# Patient Record
Sex: Female | Born: 1985 | Race: White | Hispanic: No | Marital: Married | Smoking: Never smoker
Health system: Southern US, Community
[De-identification: ages and names within clinical notes are randomized; demographics above are authoritative.]

## PROBLEM LIST (undated history)

## (undated) DIAGNOSIS — F988 Other specified behavioral and emotional disorders with onset usually occurring in childhood and adolescence: Secondary | ICD-10-CM

## (undated) DIAGNOSIS — G43909 Migraine, unspecified, not intractable, without status migrainosus: Secondary | ICD-10-CM

## (undated) DIAGNOSIS — R002 Palpitations: Secondary | ICD-10-CM

## (undated) DIAGNOSIS — S338XXA Sprain of other parts of lumbar spine and pelvis, initial encounter: Secondary | ICD-10-CM

## (undated) DIAGNOSIS — R5383 Other fatigue: Secondary | ICD-10-CM

## (undated) DIAGNOSIS — F3281 Premenstrual dysphoric disorder: Secondary | ICD-10-CM

## (undated) DIAGNOSIS — R87629 Unspecified abnormal cytological findings in specimens from vagina: Secondary | ICD-10-CM

## (undated) DIAGNOSIS — F419 Anxiety disorder, unspecified: Secondary | ICD-10-CM

## (undated) DIAGNOSIS — T7840XA Allergy, unspecified, initial encounter: Secondary | ICD-10-CM

## (undated) HISTORY — DX: Allergy, unspecified, initial encounter: T78.40XA

## (undated) HISTORY — PX: NO PAST SURGERIES: SHX2092

## (undated) HISTORY — DX: Premenstrual dysphoric disorder: F32.81

## (undated) HISTORY — DX: Unspecified abnormal cytological findings in specimens from vagina: R87.629

## (undated) HISTORY — DX: Migraine, unspecified, not intractable, without status migrainosus: G43.909

## (undated) HISTORY — DX: Palpitations: R00.2

## (undated) HISTORY — DX: Other specified behavioral and emotional disorders with onset usually occurring in childhood and adolescence: F98.8

## (undated) HISTORY — DX: Anxiety disorder, unspecified: F41.9

## (undated) HISTORY — DX: Other fatigue: R53.83

---
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
## 2005-04-28 ENCOUNTER — Ambulatory Visit: Payer: Self-pay | Admitting: "Endocrinology

## 2005-06-28 ENCOUNTER — Ambulatory Visit: Payer: Self-pay | Admitting: "Endocrinology

## 2006-12-24 ENCOUNTER — Other Ambulatory Visit: Admission: RE | Admit: 2006-12-24 | Discharge: 2006-12-24 | Payer: Self-pay | Admitting: Family Medicine

## 2007-07-17 ENCOUNTER — Other Ambulatory Visit: Admission: RE | Admit: 2007-07-17 | Discharge: 2007-07-17 | Payer: Self-pay | Admitting: Family Medicine

## 2007-12-30 ENCOUNTER — Other Ambulatory Visit: Admission: RE | Admit: 2007-12-30 | Discharge: 2007-12-30 | Payer: Self-pay | Admitting: Family Medicine

## 2008-07-06 ENCOUNTER — Other Ambulatory Visit: Admission: RE | Admit: 2008-07-06 | Discharge: 2008-07-06 | Payer: Self-pay | Admitting: Family Medicine

## 2009-01-28 ENCOUNTER — Other Ambulatory Visit: Admission: RE | Admit: 2009-01-28 | Discharge: 2009-01-28 | Payer: Self-pay | Admitting: Family Medicine

## 2010-03-22 ENCOUNTER — Other Ambulatory Visit: Admission: RE | Admit: 2010-03-22 | Discharge: 2010-03-22 | Payer: Self-pay | Admitting: Family Medicine

## 2011-03-27 ENCOUNTER — Other Ambulatory Visit (HOSPITAL_COMMUNITY)
Admission: RE | Admit: 2011-03-27 | Discharge: 2011-03-27 | Disposition: A | Discharge status: Home / Self Care | Payer: BC Managed Care – PPO | Source: Ambulatory Visit | Attending: Family Medicine | Admitting: Family Medicine

## 2011-03-27 ENCOUNTER — Other Ambulatory Visit: Payer: Self-pay | Admitting: Family Medicine

## 2011-03-27 DIAGNOSIS — Z0000 Encounter for general adult medical examination without abnormal findings: Secondary | ICD-10-CM | POA: Insufficient documentation

## 2011-03-27 DIAGNOSIS — Z113 Encounter for screening for infections with a predominantly sexual mode of transmission: Secondary | ICD-10-CM | POA: Insufficient documentation

## 2011-03-27 DIAGNOSIS — R87810 Cervical high risk human papillomavirus (HPV) DNA test positive: Secondary | ICD-10-CM | POA: Insufficient documentation

## 2011-04-10 ENCOUNTER — Other Ambulatory Visit: Payer: Self-pay | Admitting: Family Medicine

## 2011-11-28 ENCOUNTER — Other Ambulatory Visit (HOSPITAL_COMMUNITY)
Admission: RE | Admit: 2011-11-28 | Discharge: 2011-11-28 | Disposition: A | Discharge status: Home / Self Care | Payer: BC Managed Care – PPO | Source: Ambulatory Visit | Attending: Family Medicine | Admitting: Family Medicine

## 2011-11-28 ENCOUNTER — Other Ambulatory Visit: Payer: Self-pay | Admitting: Family Medicine

## 2011-11-28 DIAGNOSIS — R87619 Unspecified abnormal cytological findings in specimens from cervix uteri: Secondary | ICD-10-CM | POA: Insufficient documentation

## 2012-07-29 ENCOUNTER — Other Ambulatory Visit: Payer: Self-pay | Admitting: Family Medicine

## 2012-07-29 ENCOUNTER — Other Ambulatory Visit (HOSPITAL_COMMUNITY)
Admission: RE | Admit: 2012-07-29 | Discharge: 2012-07-29 | Disposition: A | Discharge status: Home / Self Care | Payer: BC Managed Care – PPO | Source: Ambulatory Visit | Attending: Family Medicine | Admitting: Family Medicine

## 2012-07-29 DIAGNOSIS — Z01419 Encounter for gynecological examination (general) (routine) without abnormal findings: Secondary | ICD-10-CM | POA: Insufficient documentation

## 2013-02-11 ENCOUNTER — Other Ambulatory Visit: Payer: Self-pay | Admitting: Family Medicine

## 2013-02-11 ENCOUNTER — Other Ambulatory Visit (HOSPITAL_COMMUNITY)
Admission: RE | Admit: 2013-02-11 | Discharge: 2013-02-11 | Disposition: A | Discharge status: Home / Self Care | Payer: BC Managed Care – PPO | Source: Ambulatory Visit | Attending: Family Medicine | Admitting: Family Medicine

## 2013-02-11 DIAGNOSIS — R87619 Unspecified abnormal cytological findings in specimens from cervix uteri: Secondary | ICD-10-CM | POA: Insufficient documentation

## 2014-06-22 ENCOUNTER — Other Ambulatory Visit: Payer: Self-pay | Admitting: Family Medicine

## 2014-06-22 ENCOUNTER — Other Ambulatory Visit (HOSPITAL_COMMUNITY)
Admission: RE | Admit: 2014-06-22 | Discharge: 2014-06-22 | Disposition: A | Discharge status: Home / Self Care | Payer: BC Managed Care – PPO | Source: Ambulatory Visit | Attending: Family Medicine | Admitting: Family Medicine

## 2014-06-22 DIAGNOSIS — Z124 Encounter for screening for malignant neoplasm of cervix: Secondary | ICD-10-CM | POA: Diagnosis not present

## 2014-06-25 LAB — CYTOLOGY - PAP

## 2015-07-11 DIAGNOSIS — S338XXA Sprain of other parts of lumbar spine and pelvis, initial encounter: Secondary | ICD-10-CM

## 2015-07-11 HISTORY — DX: Sprain of other parts of lumbar spine and pelvis, initial encounter: S33.8XXA

## 2016-07-25 ENCOUNTER — Other Ambulatory Visit (HOSPITAL_COMMUNITY)
Admission: RE | Admit: 2016-07-25 | Discharge: 2016-07-25 | Disposition: A | Discharge status: Home / Self Care | Payer: BC Managed Care – PPO | Source: Ambulatory Visit | Attending: Family Medicine | Admitting: Family Medicine

## 2016-07-25 ENCOUNTER — Other Ambulatory Visit: Payer: Self-pay | Admitting: Family Medicine

## 2016-07-25 DIAGNOSIS — Z124 Encounter for screening for malignant neoplasm of cervix: Secondary | ICD-10-CM | POA: Diagnosis present

## 2016-08-01 LAB — CYTOLOGY - PAP: Diagnosis: NEGATIVE

## 2016-12-25 LAB — OB RESULTS CONSOLE RUBELLA ANTIBODY, IGM: RUBELLA: IMMUNE

## 2016-12-25 LAB — OB RESULTS CONSOLE HIV ANTIBODY (ROUTINE TESTING): HIV: NONREACTIVE

## 2016-12-25 LAB — OB RESULTS CONSOLE HEPATITIS B SURFACE ANTIGEN: HEP B S AG: NEGATIVE

## 2016-12-25 LAB — OB RESULTS CONSOLE ABO/RH: RH TYPE: POSITIVE

## 2016-12-25 LAB — OB RESULTS CONSOLE ANTIBODY SCREEN: Antibody Screen: NEGATIVE

## 2016-12-25 LAB — OB RESULTS CONSOLE RPR: RPR: NONREACTIVE

## 2017-01-22 LAB — OB RESULTS CONSOLE GC/CHLAMYDIA
Chlamydia: NEGATIVE
GC PROBE AMP, GENITAL: NEGATIVE

## 2017-04-13 ENCOUNTER — Ambulatory Visit: Payer: BC Managed Care – PPO | Admitting: Internal Medicine

## 2017-04-24 ENCOUNTER — Encounter: Payer: Self-pay | Admitting: Physician Assistant

## 2017-04-24 ENCOUNTER — Ambulatory Visit (INDEPENDENT_AMBULATORY_CARE_PROVIDER_SITE_OTHER): Payer: BC Managed Care – PPO | Admitting: Physician Assistant

## 2017-04-24 ENCOUNTER — Encounter (INDEPENDENT_AMBULATORY_CARE_PROVIDER_SITE_OTHER): Payer: Self-pay

## 2017-04-24 VITALS — BP 108/60 | HR 90 | Ht 64.0 in | Wt 249.0 lb

## 2017-04-24 DIAGNOSIS — Z3A20 20 weeks gestation of pregnancy: Secondary | ICD-10-CM | POA: Diagnosis not present

## 2017-04-24 DIAGNOSIS — R002 Palpitations: Secondary | ICD-10-CM | POA: Diagnosis not present

## 2017-04-24 NOTE — Progress Notes (Signed)
Cardiology Office Note    Date:  04/24/2017   ID:  REIZY DUNLOW, DOB 1986/04/01, MRN [id number]  PCP:  Laurann Montana, MD  Cardiologist:  New to Dr. Eden Emms   Chief Complaint: Palpitation  History of Present Illness:   Doris Martinez is a 31 y.o. female with possible hyperthyroidism ? (only took supplement for 1 months) , ADD and anxiety referred by Precious Gilding, MD(obgyn) for palpitations.   Normal TSH 04/10/17.  She is [redacted] weeks pregnant. Patient has noted palpitations approximately 2-3 weeks ago. Her baseline heart rate in 80s. However she feels palpitation when her rate goes  to 90s at rest. She does not feel palpitation when heart rate about 110 during activity. She denies chest pain, shortness of breath, orthopnea, PND, syncope, lower extremity edema or melena. She did have a history of palpitation at age 16. No workup needed at that time.   Social smoker during college years.  Past Medical History:  Diagnosis Date  . Abnormal vaginal Pap smear   . ADD (attention deficit disorder)   . Allergy   . Anxiety   . Fatigue   . Heart palpitations   . Hyperthyroidism   . Migraine headache   . Pelvic fracture (HCC)   . Premenstrual dysphoric disorder     History reviewed. No pertinent surgical history.  Current Medications:  Prior to Admission medications   Medication Sig Start Date End Date Taking? Authorizing Provider  hydrOXYzine (ATARAX/VISTARIL) 25 MG tablet Take 25 mg by mouth 2 (two) times daily as needed for anxiety.   Yes [provider]  Omega-3 Fatty Acids (FISH OIL PO) Take 1 capsule by mouth daily.   Yes [provider]  Prenatal Vit-Fe Fumarate-FA (PRENATAL VITAMIN PO) Take 1 tablet by mouth daily.   Yes [provider]   Allergies:   Patient has no known allergies.   Social History   Social History  . Marital status: Single    Spouse name: N/A  . Number of children: N/A  . Years of education: N/A   Social History Main  Topics  . Smoking status: Never Smoker  . Smokeless tobacco: Never Used  . Alcohol use 1.2 oz/week    2 Glasses of wine per week     Comment: occasionally  . Drug use: No  . Sexual activity: Not Asked     Comment: married Joselyn Glassman)   Other Topics Concern  . None   Social History Narrative  . None     Family History:  The patient's family history includes Allergies in her father and mother; CAD in her paternal grandfather; Dementia in her maternal grandmother; Diabetes in her paternal grandmother; Hypertension in her father and paternal grandfather; Varicose Veins in her mother.   ROS:   Please see the history of present illness.    ROS All other systems reviewed and are negative.   PHYSICAL EXAM:   VS:  BP 108/60   Pulse 90   Ht  (1.626 m)   Wt 249 lb (112.9 kg)   BMI 42.74 kg/m    GEN: Well nourished, well developed, in no acute distress  HEENT: normal  Neck: no JVD, carotid bruits, or masses Cardiac: RRR; no murmurs, rubs, or gallops,no edema  Respiratory:  clear to auscultation bilaterally, normal work of breathing GI: soft, nontender, nondistended, + BS MS: no deformity or atrophy  Skin: warm and dry, no rash Neuro:  Alert and Oriented x 3, Strength and sensation are  intact Psych: euthymic mood, full affect  Wt Readings from Last 3 Encounters:  04/24/17 249 lb (112.9 kg)      Studies/Labs Reviewed:   EKG:  EKG is ordered today.  The ekg ordered today demonstrates NSR  Recent Labs: No results found for requested labs within last 8760 hours.   Lipid Panel No results found for: CHOL, TRIG, HDL, CHOLHDL, VLDL, LDLCALC, LDLDIRECT  Additional studies/ records that were reviewed today include:   As above    ASSESSMENT & PLAN:    1. Palpitation - Improving in past one week. Likely related to pregnancy. Encouraged increasing fluid intake. Get echocardiogram to rule out any structural or valvular abnormality. No history of premature cardiac death in  family. Normal TSH.   Reviewed with Dr. Eden Emms  Medication Adjustments/Labs and Tests Ordered: Current medicines are reviewed at length with the patient today.  Concerns regarding medicines are outlined above.  Medication changes, Labs and Tests ordered today are listed in the Patient Instructions below. Patient Instructions  Medication Instructions:  Your physician recommends that you continue on your current medications as directed. Please refer to the Current Medication list given to you today.   Labwork: None Ordered   Testing/Procedures: Your physician has requested that you have an echocardiogram. Echocardiography is a painless test that uses sound waves to create images of your heart. It provides your doctor with information about the size and shape of your heart and how well your heart's chambers and valves are working. This procedure takes approximately one hour. There are no restrictions for this procedure.   Follow-Up: Your physician recommends that you schedule a follow-up appointment in: as needed with Dr. Eden Emms pending the results of the echocardiogram   If you need a refill on your cardiac medications before your next appointment, please call your pharmacy.   Thank you for choosing [facility]! Eligha Bridegroom, RN [phone]       Signed, Manson Passey, PA  04/24/2017 4:00 PM    [facility] [address] [place], [place], [place]  [address] Phone: [phone]; Fax: [phone]

## 2017-04-24 NOTE — Patient Instructions (Signed)
Medication Instructions:  Your physician recommends that you continue on your current medications as directed. Please refer to the Current Medication list given to you today.   Labwork: None Ordered   Testing/Procedures: Your physician has requested that you have an echocardiogram. Echocardiography is a painless test that uses sound waves to create images of your heart. It provides your doctor with information about the size and shape of your heart and how well your heart's chambers and valves are working. This procedure takes approximately one hour. There are no restrictions for this procedure.   Follow-Up: Your physician recommends that you schedule a follow-up appointment in: as needed with Dr. Eden Emms pending the results of the echocardiogram   If you need a refill on your cardiac medications before your next appointment, please call your pharmacy.   Thank you for choosing [facility]! Eligha Bridegroom, RN [phone]

## 2017-05-03 ENCOUNTER — Ambulatory Visit: Payer: BC Managed Care – PPO | Admitting: Cardiology

## 2017-05-08 LAB — OB RESULTS CONSOLE HIV ANTIBODY (ROUTINE TESTING): HIV: NONREACTIVE

## 2017-05-08 LAB — OB RESULTS CONSOLE RPR: RPR: NONREACTIVE

## 2017-05-11 ENCOUNTER — Other Ambulatory Visit: Payer: Self-pay

## 2017-05-11 ENCOUNTER — Ambulatory Visit (HOSPITAL_COMMUNITY): Payer: BC Managed Care – PPO | Attending: Cardiology

## 2017-05-11 DIAGNOSIS — O26892 Other specified pregnancy related conditions, second trimester: Secondary | ICD-10-CM | POA: Insufficient documentation

## 2017-05-11 DIAGNOSIS — Z3A25 25 weeks gestation of pregnancy: Secondary | ICD-10-CM | POA: Diagnosis not present

## 2017-05-11 DIAGNOSIS — R002 Palpitations: Secondary | ICD-10-CM | POA: Insufficient documentation

## 2017-05-11 DIAGNOSIS — I071 Rheumatic tricuspid insufficiency: Secondary | ICD-10-CM | POA: Diagnosis not present

## 2017-05-11 DIAGNOSIS — Z8249 Family history of ischemic heart disease and other diseases of the circulatory system: Secondary | ICD-10-CM | POA: Insufficient documentation

## 2017-05-23 ENCOUNTER — Other Ambulatory Visit: Payer: Self-pay | Admitting: Obstetrics and Gynecology

## 2017-05-23 DIAGNOSIS — Z3689 Encounter for other specified antenatal screening: Secondary | ICD-10-CM

## 2017-05-23 DIAGNOSIS — R1011 Right upper quadrant pain: Secondary | ICD-10-CM

## 2017-05-25 ENCOUNTER — Encounter (HOSPITAL_COMMUNITY): Payer: Self-pay | Admitting: *Deleted

## 2017-05-25 ENCOUNTER — Other Ambulatory Visit: Payer: Self-pay | Admitting: Obstetrics and Gynecology

## 2017-05-25 ENCOUNTER — Ambulatory Visit (HOSPITAL_COMMUNITY)
Admission: RE | Admit: 2017-05-25 | Discharge: 2017-05-25 | Disposition: A | Discharge status: Home / Self Care | Payer: BC Managed Care – PPO | Source: Ambulatory Visit | Attending: Obstetrics and Gynecology | Admitting: Obstetrics and Gynecology

## 2017-05-25 ENCOUNTER — Encounter (HOSPITAL_COMMUNITY): Payer: Self-pay

## 2017-05-25 DIAGNOSIS — R1011 Right upper quadrant pain: Secondary | ICD-10-CM

## 2017-05-25 DIAGNOSIS — Z3689 Encounter for other specified antenatal screening: Secondary | ICD-10-CM | POA: Diagnosis not present

## 2017-05-25 DIAGNOSIS — O9989 Other specified diseases and conditions complicating pregnancy, childbirth and the puerperium: Secondary | ICD-10-CM | POA: Insufficient documentation

## 2017-05-25 HISTORY — DX: Unspecified abnormal cytological findings in specimens from vagina: R87.629

## 2017-08-18 ENCOUNTER — Encounter (HOSPITAL_COMMUNITY): Payer: Self-pay | Admitting: *Deleted

## 2017-08-18 ENCOUNTER — Inpatient Hospital Stay (HOSPITAL_COMMUNITY): Payer: BC Managed Care – PPO

## 2017-08-18 ENCOUNTER — Inpatient Hospital Stay (HOSPITAL_COMMUNITY)
Admission: AD | Admit: 2017-08-18 | Discharge: 2017-08-23 | DRG: 787 | Disposition: A | Discharge status: Home / Self Care | Payer: BC Managed Care – PPO | Source: Ambulatory Visit | Attending: Obstetrics and Gynecology | Admitting: Obstetrics and Gynecology

## 2017-08-18 ENCOUNTER — Telehealth: Payer: Self-pay | Admitting: Obstetrics and Gynecology

## 2017-08-18 ENCOUNTER — Other Ambulatory Visit: Payer: Self-pay

## 2017-08-18 DIAGNOSIS — O9912 Other diseases of the blood and blood-forming organs and certain disorders involving the immune mechanism complicating childbirth: Secondary | ICD-10-CM | POA: Diagnosis present

## 2017-08-18 DIAGNOSIS — O99214 Obesity complicating childbirth: Secondary | ICD-10-CM | POA: Diagnosis present

## 2017-08-18 DIAGNOSIS — O403XX0 Polyhydramnios, third trimester, not applicable or unspecified: Principal | ICD-10-CM | POA: Diagnosis present

## 2017-08-18 DIAGNOSIS — D696 Thrombocytopenia, unspecified: Secondary | ICD-10-CM | POA: Diagnosis present

## 2017-08-18 DIAGNOSIS — Z3A39 39 weeks gestation of pregnancy: Secondary | ICD-10-CM

## 2017-08-18 DIAGNOSIS — O409XX0 Polyhydramnios, unspecified trimester, not applicable or unspecified: Secondary | ICD-10-CM | POA: Diagnosis present

## 2017-08-18 DIAGNOSIS — O99210 Obesity complicating pregnancy, unspecified trimester: Secondary | ICD-10-CM | POA: Diagnosis present

## 2017-08-18 DIAGNOSIS — O26893 Other specified pregnancy related conditions, third trimester: Secondary | ICD-10-CM | POA: Diagnosis present

## 2017-08-18 DIAGNOSIS — F419 Anxiety disorder, unspecified: Secondary | ICD-10-CM | POA: Diagnosis present

## 2017-08-18 DIAGNOSIS — Z98891 History of uterine scar from previous surgery: Secondary | ICD-10-CM

## 2017-08-18 DIAGNOSIS — O99344 Other mental disorders complicating childbirth: Secondary | ICD-10-CM | POA: Diagnosis present

## 2017-08-18 DIAGNOSIS — Z8781 Personal history of (healed) traumatic fracture: Secondary | ICD-10-CM

## 2017-08-18 DIAGNOSIS — R002 Palpitations: Secondary | ICD-10-CM | POA: Diagnosis not present

## 2017-08-18 DIAGNOSIS — R030 Elevated blood-pressure reading, without diagnosis of hypertension: Secondary | ICD-10-CM | POA: Diagnosis present

## 2017-08-18 HISTORY — DX: Sprain of other parts of lumbar spine and pelvis, initial encounter: S33.8XXA

## 2017-08-18 LAB — CBC
HCT: 38.3 % (ref 36.0–46.0)
Hemoglobin: 12.8 g/dL (ref 12.0–15.0)
MCH: 27.6 pg (ref 26.0–34.0)
MCHC: 33.4 g/dL (ref 30.0–36.0)
MCV: 82.7 fL (ref 78.0–100.0)
PLATELETS: 154 10*3/uL (ref 150–400)
RBC: 4.63 MIL/uL (ref 3.87–5.11)
RDW: 13.9 % (ref 11.5–15.5)
WBC: 12.8 10*3/uL — ABNORMAL HIGH (ref 4.0–10.5)

## 2017-08-18 LAB — COMPREHENSIVE METABOLIC PANEL
ALT: 15 U/L (ref 14–54)
ANION GAP: 7 (ref 5–15)
AST: 22 U/L (ref 15–41)
Albumin: 2.7 g/dL — ABNORMAL LOW (ref 3.5–5.0)
Alkaline Phosphatase: 162 U/L — ABNORMAL HIGH (ref 38–126)
BUN: 7 mg/dL (ref 6–20)
CHLORIDE: 105 mmol/L (ref 101–111)
CO2: 22 mmol/L (ref 22–32)
Calcium: 8.9 mg/dL (ref 8.9–10.3)
Creatinine, Ser: 0.59 mg/dL (ref 0.44–1.00)
GFR calc non Af Amer: >60 mL/min (ref >60)
Glucose, Bld: 74 mg/dL (ref 65–99)
POTASSIUM: 4.3 mmol/L (ref 3.5–5.1)
Sodium: 134 mmol/L — ABNORMAL LOW (ref 135–145)
Total Bilirubin: 0.1 mg/dL — ABNORMAL LOW (ref 0.3–1.2)
Total Protein: 6.6 g/dL (ref 6.5–8.1)

## 2017-08-18 LAB — PROTEIN / CREATININE RATIO, URINE
CREATININE, URINE: 59 mg/dL
Protein Creatinine Ratio: 0.1 mg/mg{Cre} (ref 0.00–0.15)
TOTAL PROTEIN, URINE: 6 mg/dL

## 2017-08-18 LAB — LACTATE DEHYDROGENASE: LDH: 218 U/L — AB (ref 98–192)

## 2017-08-18 LAB — URIC ACID: URIC ACID, SERUM: 4.4 mg/dL (ref 2.3–6.6)

## 2017-08-18 MED ORDER — ZOLPIDEM TARTRATE 5 MG PO TABS: 5.0000 mg | ORAL_TABLET | Freq: Every evening | ORAL | Status: DC | PRN

## 2017-08-18 MED ORDER — CALCIUM CARBONATE ANTACID 500 MG PO CHEW: 2.0000 | CHEWABLE_TABLET | ORAL | Status: DC | PRN

## 2017-08-18 MED ORDER — PRENATAL MULTIVITAMIN CH
1.0000 | ORAL_TABLET | Freq: Every day | ORAL | Status: DC
  Administered 2017-08-18 – 2017-08-19 (×2): 1 via ORAL
  Filled 2017-08-18 (×2): qty 1

## 2017-08-18 MED ORDER — DOCUSATE SODIUM 100 MG PO CAPS
100.0000 mg | ORAL_CAPSULE | Freq: Every day | ORAL | Status: DC
  Administered 2017-08-19: 100 mg via ORAL
  Filled 2017-08-18: qty 1

## 2017-08-18 MED ORDER — ACETAMINOPHEN 325 MG PO TABS
650.0000 mg | ORAL_TABLET | ORAL | Status: DC | PRN
  Administered 2017-08-18 – 2017-08-19 (×2): 650 mg via ORAL
  Filled 2017-08-18 (×2): qty 2

## 2017-08-18 NOTE — MAU Note (Signed)
Pt. Here due to increased blood pressure at home.  Urine obtained for PCR, U/A.  Pt. Denies blurred vision, epigastric pain, positive for "some" fetal movement. EFM applied - FHR - 160s, Toco applied - abd. Soft.  No clonus noted. Pt. Denies contractions.  [facility]

## 2017-08-18 NOTE — H&P (Signed)
History   32 yo G1P0 at 45 5/7 weeks presented after calling to report increased pedal edema last 24 hours, and BPs 130s/90s at home.  Denies HA, visual sx, or epigastric pain.  No BP issues during pregnancy.  Reports positive FM.  Reports GBS negative, and EFW 7+ on [place] 2 weeks ago.       Patient Active Problem List   Diagnosis Date Noted  . Heart palpitations 08/18/2017  . Anxiety 08/18/2017  . History of pelvic fracture 08/18/2017  . Elevated BP without diagnosis of hypertension 08/18/2017  . Maternal obesity affecting pregnancy, antepartum--BMI 44 08/18/2017  . Polyhydramnios affecting pregnancy in third trimester 08/18/2017  Had cardiac evaluation for palpitations during pregnancy 04/2017), WNL, normal TSH. Hx pelvic fracture (no imaging)--     Chief Complaint  Patient presents with  . Hypertension             OB History    Gravida Para Term Preterm AB Living   1             SAB TAB Ectopic Multiple Live Births                      Past Medical History:  Diagnosis Date  . Abnormal vaginal Pap smear   . ADD (attention deficit disorder)   . Allergy   . Anxiety   . Fatigue   . Heart palpitations   . Migraine headache   . Pelvic fracture (HCC)   . Premenstrual dysphoric disorder   . Vaginal Pap smear, abnormal          Past Surgical History:  Procedure Laterality Date  . NO PAST SURGERIES      Family History  Problem Relation Age of Onset  . Allergies Mother   . Varicose Veins Mother   . Hypertension Father   . Allergies Father   . Dementia Maternal Grandmother   . Diabetes Paternal Grandmother   . Hypertension Paternal Grandfather   . CAD Paternal Grandfather     Social History        Tobacco Use  . Smoking status: Never Smoker  . Smokeless tobacco: Never Used  Substance Use Topics  . Alcohol use: No    Alcohol/week: 1.2 oz    Types: 2 Glasses of wine per week    Frequency: Never     Comment: occasionally  . Drug use: No    Allergies:       Allergies  Allergen Reactions  . Naproxen Itching    Itchy throat           Medications Prior to Admission  Medication Sig Dispense Refill Last Dose  . acetaminophen (TYLENOL) 500 MG tablet Take 1,000 mg by mouth every 6 (six) hours as needed for moderate pain.   Past Week at Unknown time  . Omega-3 Fatty Acids (FISH OIL PO) Take 1 capsule by mouth daily.   08/17/2017 at Unknown time  . Prenatal Vit-Fe Fumarate-FA (PRENATAL VITAMIN PO) Take 1 tablet by mouth daily.   08/17/2017 at Unknown time    Review of Systems  Cardiovascular: Positive for leg swelling.  All other systems reviewed and are negative.   Prenatal Transfer Tool  Maternal Diabetes: No Genetic Screening: Normal Panorama, female; normal AFP Maternal Ultrasounds/Referrals: Abnormal:  Findings:   Other:Polyhydramnios 08/18/17 Fetal Ultrasounds or other Referrals:  None Maternal Substance Abuse:  No Significant Maternal Medications:  None Significant Maternal Lab Results: Lab values include: Group B Strep negative  PRENATAL LABS: A+ Antibody neg RPR NR HBSAG negative HIV NR Glucola negative GBS negative Panorama low risk, female AFP WNL Hgb 12.3 at NOB Plat 230 at NOB   Physical Exam   Blood pressure 112/78, pulse 94, temperature 98 F (36.7 C), temperature source Oral, resp. rate 18, height 5\' 4"  (1.626 m), weight 116.9 kg (257 lb 12 oz), last menstrual period 11/13/2016.    Physical Exam  Appears moderately anxious Chest clear Heart RRR without murmur Abd gravid, NT, FH 41 cm Pelvic 1 cm, 50%, vtx, -1. Ext tr/1+ edema, DTR 1+, no clonus  FHR baseline initially difficult to determine, then more evidence for 150-160, with broad accels to 180-200, no decels, frequent FM.  ED Course  Assessment: IUP at 39 5/7 weeks ? Gestational hypertension GBS negative Equivocal FHR tracing  Plan: PIH labs and PCR Serial  BPs BPP   [name] CNM, MSN 08/18/2017 3:04 PM   Addendum: Returned from BPP--6/8 (did not meet full criteria for frequency of FBM), AFI 31.27, >97%ile, vtx. FHR prior to US--baseline 130-140, accels to 200, no UCs.        Vitals:   08/18/17 1130 08/18/17 1145 08/18/17 1200 08/18/17 1215  BP: 112/75 109/77 112/60 112/78  Pulse: 88 95 91 94  Resp:      Temp:      TempSrc:      Weight:      Height:       BP range 112-140/84-60  LabResultsLast24Hours       Results for orders placed or performed during the hospital encounter of 08/18/17 (from the past 24 hour(s))  Protein / creatinine ratio, urine     Status: None   Collection Time: 08/18/17 11:00 AM  Result Value Ref Range   Creatinine, Urine 59.00 mg/dL   Total Protein, Urine 6 mg/dL   Protein Creatinine Ratio 0.10 0.00 - 0.15 mg/mg[Cre]  CBC     Status: Abnormal   Collection Time: 08/18/17 11:01 AM  Result Value Ref Range   WBC 12.8 (H) 4.0 - 10.5 K/uL   RBC 4.63 3.87 - 5.11 MIL/uL   Hemoglobin 12.8 12.0 - 15.0 g/dL   HCT 16.1 09.6 - 04.5 %   MCV 82.7 78.0 - 100.0 fL   MCH 27.6 26.0 - 34.0 pg   MCHC 33.4 30.0 - 36.0 g/dL   RDW 40.9 81.1 - 91.4 %   Platelets 154 150 - 400 K/uL  Comprehensive metabolic panel     Status: Abnormal   Collection Time: 08/18/17 11:01 AM  Result Value Ref Range   Sodium 134 (L) 135 - 145 mmol/L   Potassium 4.3 3.5 - 5.1 mmol/L   Chloride 105 101 - 111 mmol/L   CO2 22 22 - 32 mmol/L   Glucose, Bld 74 65 - 99 mg/dL   BUN 7 6 - 20 mg/dL   Creatinine, Ser 7.82 0.44 - 1.00 mg/dL   Calcium 8.9 8.9 - 95.6 mg/dL   Total Protein 6.6 6.5 - 8.1 g/dL   Albumin 2.7 (L) 3.5 - 5.0 g/dL   AST 22 15 - 41 U/L   ALT 15 14 - 54 U/L   Alkaline Phosphatase 162 (H) 38 - 126 U/L   Total Bilirubin 0.1 (L) 0.3 - 1.2 mg/dL   GFR calc non Af Amer >60 >60 mL/min   GFR calc Af Amer >60 >60 mL/min   Anion gap 7 5 - 15  Lactate dehydrogenase      Status: Abnormal  Collection Time: 08/18/17 11:01 AM  Result Value Ref Range   LDH 218 (H) 98 - 192 U/L  Uric acid     Status: None   Collection Time: 08/18/17 11:01 AM  Result Value Ref Range   Uric Acid, Serum 4.4 2.3 - 6.6 mg/dL     Platelets [id number] at NOB.  Impression: IUP at 39 5/7 weeks Polyhydramnios BPP 6/8  Plan: Per consult with Dr. Earlie RavelingPinn--plan admission for observation. Continuous EFM Repeat BPP in am Will repeat CBC/CMP in am, with T&S. Reviewed plan of care with patient, advising her the goal is to make the best decision regarding management. Patient understands possible outcomes are decision for d/c tomorrow or continued admission for induction.  Nigel BridgemanVicki Marcell Pfeifer, CNM 08/18/17 2:34p

## 2017-08-18 NOTE — Progress Notes (Signed)
In to see patient and check on her status. Remains anxious--"googled polyhydramnios", made her more anxious. Reviewed plan of care with patient and husband.  Vitals:   08/18/17 1145 08/18/17 1200 08/18/17 1215 08/18/17 1623  BP: 109/77 112/60 112/78 127/72  Pulse: 95 91 94 89  Resp:    18  Temp:    98.6 F (37 C)  TempSrc:    Oral  SpO2:    99%  Weight:      Height:       FHR Category 1  Will change to NST TID for patient comfort and ability to rest.  Re-evaluate BPP in am, consult MDs again for plan of care.  Nigel BridgemanVicki Christiano Martinez, CNM 08/18/17 5:30p

## 2017-08-18 NOTE — Telephone Encounter (Signed)
TC from patient--39 5/7 weeks, G1P0, patient of Dr. Dion BodyVarnado.  Noted increased swelling of feet last night, did not reduce much overnight.  Took BP this am x 3 130s/90s.  Denies HA, visual sx, or epigastric pain. No BP issues during pregnancy.  Requested patient come to MAU for PIH evaluation. Will see in MAU.  Doris BridgemanVicki Treva Martinez, CNM 08/18/17 1010

## 2017-08-18 NOTE — MAU Provider Note (Signed)
History   32 yo G1P0 at 3739 5/7 weeks presented after calling to report increased pedal edema last 24 hours, and BPs 130s/90s at home.  Denies HA, visual sx, or epigastric pain.  No BP issues during pregnancy.  Reports positive FM.  Reports GBS negative, and EFW 7+ on US 2 weeks ago.   Patient Active Problem List   Diagnosis Date Noted  . Heart palpitations 08/18/2017  . Anxiety 08/18/2017  . History of pelvic fracture 08/18/2017  . Elevated BP without diagnosis of hypertension 08/18/2017  . Maternal obesity affecting pregnancy, antepartum--BMI 44 08/18/2017  . Polyhydramnios affecting pregnancy in third trimester 08/18/2017  Had cardiac evaluation for palpitations during pregnancy 04/2017), WNL, normal TSH. Hx pelvic fracture (no imaging)--  Chief Complaint  Patient presents with  . Hypertension     OB History    Gravida Para Term Preterm AB Living   1             SAB TAB Ectopic Multiple Live Births                  Past Medical History:  Diagnosis Date  . Abnormal vaginal Pap smear   . ADD (attention deficit disorder)   . Allergy   . Anxiety   . Fatigue   . Heart palpitations   . Migraine headache   . Pelvic fracture (HCC)   . Premenstrual dysphoric disorder   . Vaginal Pap smear, abnormal     Past Surgical History:  Procedure Laterality Date  . NO PAST SURGERIES      Family History  Problem Relation Age of Onset  . Allergies Mother   . Varicose Veins Mother   . Hypertension Father   . Allergies Father   . Dementia Maternal Grandmother   . Diabetes Paternal Grandmother   . Hypertension Paternal Grandfather   . CAD Paternal Grandfather     Social History   Tobacco Use  . Smoking status: Never Smoker  . Smokeless tobacco: Never Used  Substance Use Topics  . Alcohol use: No    Alcohol/week: 1.2 oz    Types: 2 Glasses of wine per week    Frequency: Never    Comment: occasionally  . Drug use: No    Allergies:  Allergies  Allergen Reactions   . Naproxen Itching    Itchy throat    Medications Prior to Admission  Medication Sig Dispense Refill Last Dose  . acetaminophen (TYLENOL) 500 MG tablet Take 1,000 mg by mouth every 6 (six) hours as needed for moderate pain.   Past Week at Unknown time  . Omega-3 Fatty Acids (FISH OIL PO) Take 1 capsule by mouth daily.   08/17/2017 at Unknown time  . Prenatal Vit-Fe Fumarate-FA (PRENATAL VITAMIN PO) Take 1 tablet by mouth daily.   08/17/2017 at Unknown time    Review of Systems  Cardiovascular: Positive for leg swelling.  All other systems reviewed and are negative.   Prenatal Transfer Tool  Maternal Diabetes: No Genetic Screening: Normal Panorama, female; normal AFP Maternal Ultrasounds/Referrals: Abnormal:  Findings:   Other:Polyhydramnios 08/18/17 Fetal Ultrasounds or other Referrals:  None Maternal Substance Abuse:  No Significant Maternal Medications:  None Significant Maternal Lab Results: Lab values include: Group B Strep negative   PRENATAL LABS: A+ Antibody neg RPR NR HBSAG negative HIV NR Glucola negative GBS negative Panorama low risk, female AFP WNL Hgb 12.3 at NOB Plat 230 at NOB   Physical Exam   Blood pressure 112/78, pulse  94, temperature 98 F (36.7 C), temperature source Oral, resp. rate 18, height 5\' 4"  (1.626 m), weight 116.9 kg (257 lb 12 oz), last menstrual period 11/13/2016.    Physical Exam  Appears moderately anxious Chest clear Heart RRR without murmur Abd gravid, NT, FH 41 cm Pelvic 1 cm, 50%, vtx, -1. Ext tr/1+ edema, DTR 1+, no clonus  FHR baseline initially difficult to determine, then more evidence for 150-160, with broad accels to 180-200, no decels, frequent FM.  ED Course  Assessment: IUP at 39 5/7 weeks ? Gestational hypertension GBS negative Equivocal FHR tracing  Plan: PIH labs and PCR Serial BPs BPP   [name] CNM, MSN 08/18/2017 3:04 PM   Addendum: Returned from BPP--6/8 (did not meet full criteria for  frequency of FBM), AFI 31.27, >97%ile, vtx. FHR prior to US--baseline 130-140, accels to 200, no UCs.  Vitals:   08/18/17 1130 08/18/17 1145 08/18/17 1200 08/18/17 1215  BP: 112/75 109/77 112/60 112/78  Pulse: 88 95 91 94  Resp:      Temp:      TempSrc:      Weight:      Height:       BP range 112-140/84-60  Results for orders placed or performed during the hospital encounter of 08/18/17 (from the past 24 hour(s))  Protein / creatinine ratio, urine     Status: None   Collection Time: 08/18/17 11:00 AM  Result Value Ref Range   Creatinine, Urine 59.00 mg/dL   Total Protein, Urine 6 mg/dL   Protein Creatinine Ratio 0.10 0.00 - 0.15 mg/mg[Cre]  CBC     Status: Abnormal   Collection Time: 08/18/17 11:01 AM  Result Value Ref Range   WBC 12.8 (H) 4.0 - 10.5 K/uL   RBC 4.63 3.87 - 5.11 MIL/uL   Hemoglobin 12.8 12.0 - 15.0 g/dL   HCT 16.1 09.6 - 04.5 %   MCV 82.7 78.0 - 100.0 fL   MCH 27.6 26.0 - 34.0 pg   MCHC 33.4 30.0 - 36.0 g/dL   RDW 40.9 81.1 - 91.4 %   Platelets 154 150 - 400 K/uL  Comprehensive metabolic panel     Status: Abnormal   Collection Time: 08/18/17 11:01 AM  Result Value Ref Range   Sodium 134 (L) 135 - 145 mmol/L   Potassium 4.3 3.5 - 5.1 mmol/L   Chloride 105 101 - 111 mmol/L   CO2 22 22 - 32 mmol/L   Glucose, Bld 74 65 - 99 mg/dL   BUN 7 6 - 20 mg/dL   Creatinine, Ser 7.82 0.44 - 1.00 mg/dL   Calcium 8.9 8.9 - 95.6 mg/dL   Total Protein 6.6 6.5 - 8.1 g/dL   Albumin 2.7 (L) 3.5 - 5.0 g/dL   AST 22 15 - 41 U/L   ALT 15 14 - 54 U/L   Alkaline Phosphatase 162 (H) 38 - 126 U/L   Total Bilirubin 0.1 (L) 0.3 - 1.2 mg/dL   GFR calc non Af Amer >60 >60 mL/min   GFR calc Af Amer >60 >60 mL/min   Anion gap 7 5 - 15  Lactate dehydrogenase     Status: Abnormal   Collection Time: 08/18/17 11:01 AM  Result Value Ref Range   LDH 218 (H) 98 - 192 U/L  Uric acid     Status: None   Collection Time: 08/18/17 11:01 AM  Result Value Ref Range   Uric Acid, Serum 4.4  2.3 - 6.6 mg/dL  Platelets 168 at NOB.  Impression: IUP at 39 5/7 weeks Polyhydramnios BPP 6/8  Plan: Per consult with Dr. Earlie Raveling admission for observation. Continuous EFM Repeat BPP in am Will repeat CBC/CMP in am, with T&S. Reviewed plan of care with patient, advising her the goal is to make the best decision regarding management. Patient understands possible outcomes are decision for d/c tomorrow or continued admission for induction.  [name], CNM 08/18/17 2:34p

## 2017-08-18 NOTE — Plan of Care (Signed)
POC discussed with pt, no questions or concerns at this time.   

## 2017-08-19 ENCOUNTER — Inpatient Hospital Stay (HOSPITAL_COMMUNITY): Payer: BC Managed Care – PPO

## 2017-08-19 ENCOUNTER — Encounter (HOSPITAL_COMMUNITY): Payer: Self-pay | Admitting: *Deleted

## 2017-08-19 DIAGNOSIS — D696 Thrombocytopenia, unspecified: Secondary | ICD-10-CM | POA: Diagnosis present

## 2017-08-19 DIAGNOSIS — O403XX0 Polyhydramnios, third trimester, not applicable or unspecified: Secondary | ICD-10-CM | POA: Diagnosis present

## 2017-08-19 DIAGNOSIS — O9912 Other diseases of the blood and blood-forming organs and certain disorders involving the immune mechanism complicating childbirth: Secondary | ICD-10-CM | POA: Diagnosis present

## 2017-08-19 DIAGNOSIS — Z3A39 39 weeks gestation of pregnancy: Secondary | ICD-10-CM | POA: Diagnosis not present

## 2017-08-19 DIAGNOSIS — O409XX0 Polyhydramnios, unspecified trimester, not applicable or unspecified: Secondary | ICD-10-CM | POA: Diagnosis present

## 2017-08-19 DIAGNOSIS — F419 Anxiety disorder, unspecified: Secondary | ICD-10-CM | POA: Diagnosis present

## 2017-08-19 DIAGNOSIS — R030 Elevated blood-pressure reading, without diagnosis of hypertension: Secondary | ICD-10-CM | POA: Diagnosis present

## 2017-08-19 DIAGNOSIS — O99344 Other mental disorders complicating childbirth: Secondary | ICD-10-CM | POA: Diagnosis present

## 2017-08-19 DIAGNOSIS — O99214 Obesity complicating childbirth: Secondary | ICD-10-CM | POA: Diagnosis present

## 2017-08-19 DIAGNOSIS — O26893 Other specified pregnancy related conditions, third trimester: Secondary | ICD-10-CM | POA: Diagnosis present

## 2017-08-19 LAB — CBC
HCT: 36.1 % (ref 36.0–46.0)
HCT: 35.9 % — ABNORMAL LOW (ref 36.0–46.0)
HEMOGLOBIN: 12.1 g/dL (ref 12.0–15.0)
Hemoglobin: 12.1 g/dL (ref 12.0–15.0)
MCH: 27.8 pg (ref 26.0–34.0)
MCH: 27.8 pg (ref 26.0–34.0)
MCHC: 33.5 g/dL (ref 30.0–36.0)
MCHC: 33.7 g/dL (ref 30.0–36.0)
MCV: 83 fL (ref 78.0–100.0)
MCV: 82.3 fL (ref 78.0–100.0)
PLATELETS: 140 10*3/uL — AB (ref 150–400)
Platelets: 154 10*3/uL (ref 150–400)
RBC: 4.35 MIL/uL (ref 3.87–5.11)
RBC: 4.36 MIL/uL (ref 3.87–5.11)
RDW: 13.9 % (ref 11.5–15.5)
RDW: 14 % (ref 11.5–15.5)
WBC: 9.7 10*3/uL (ref 4.0–10.5)
WBC: 11.9 10*3/uL — ABNORMAL HIGH (ref 4.0–10.5)

## 2017-08-19 LAB — COMPREHENSIVE METABOLIC PANEL
ALBUMIN: 2.4 g/dL — AB (ref 3.5–5.0)
ALT: 14 U/L (ref 14–54)
ANION GAP: 9 (ref 5–15)
AST: 17 U/L (ref 15–41)
Alkaline Phosphatase: 143 U/L — ABNORMAL HIGH (ref 38–126)
BILIRUBIN TOTAL: 0.3 mg/dL (ref 0.3–1.2)
BUN: 7 mg/dL (ref 6–20)
CO2: 21 mmol/L — AB (ref 22–32)
Calcium: 8.4 mg/dL — ABNORMAL LOW (ref 8.9–10.3)
Chloride: 105 mmol/L (ref 101–111)
Creatinine, Ser: 0.65 mg/dL (ref 0.44–1.00)
GFR calc Af Amer: >60 mL/min (ref >60)
GFR calc non Af Amer: >60 mL/min (ref >60)
Glucose, Bld: 79 mg/dL (ref 65–99)
POTASSIUM: 3.9 mmol/L (ref 3.5–5.1)
SODIUM: 135 mmol/L (ref 135–145)
TOTAL PROTEIN: 5.7 g/dL — AB (ref 6.5–8.1)

## 2017-08-19 LAB — TYPE AND SCREEN
ABO/RH(D): A POS
ANTIBODY SCREEN: NEGATIVE

## 2017-08-19 LAB — ABO/RH: ABO/RH(D): A POS

## 2017-08-19 MED ORDER — FLEET ENEMA 7-19 GM/118ML RE ENEM: 1.0000 | ENEMA | RECTAL | Status: DC | PRN

## 2017-08-19 MED ORDER — SODIUM CHLORIDE 0.9% FLUSH
3.0000 mL | INTRAVENOUS | Status: DC | PRN
  Administered 2017-08-19: 3 mL via INTRAVENOUS
  Filled 2017-08-19: qty 3

## 2017-08-19 MED ORDER — SODIUM CHLORIDE 0.9% FLUSH: 3.0000 mL | Freq: Two times a day (BID) | INTRAVENOUS | Status: DC

## 2017-08-19 MED ORDER — PHENYLEPHRINE 40 MCG/ML (10ML) SYRINGE FOR IV PUSH (FOR BLOOD PRESSURE SUPPORT): 80.0000 ug | PREFILLED_SYRINGE | INTRAVENOUS | Status: DC | PRN

## 2017-08-19 MED ORDER — OXYTOCIN 40 UNITS IN LACTATED RINGERS INFUSION - SIMPLE MED: 2.5000 [IU]/h | INTRAVENOUS | Status: DC

## 2017-08-19 MED ORDER — PHENYLEPHRINE 40 MCG/ML (10ML) SYRINGE FOR IV PUSH (FOR BLOOD PRESSURE SUPPORT)
80.0000 ug | PREFILLED_SYRINGE | INTRAVENOUS | Status: DC | PRN
  Filled 2017-08-19: qty 10

## 2017-08-19 MED ORDER — OXYTOCIN 40 UNITS IN LACTATED RINGERS INFUSION - SIMPLE MED
1.0000 m[IU]/min | INTRAVENOUS | Status: DC
  Administered 2017-08-20: 8 m[IU]/min via INTRAVENOUS
  Administered 2017-08-20: 1 m[IU]/min via INTRAVENOUS
  Filled 2017-08-19: qty 1000

## 2017-08-19 MED ORDER — LACTATED RINGERS IV SOLN
500.0000 mL | Freq: Once | INTRAVENOUS | Status: AC
  Administered 2017-08-20: 500 mL via INTRAVENOUS

## 2017-08-19 MED ORDER — FENTANYL CITRATE (PF) 100 MCG/2ML IJ SOLN: 100.0000 ug | INTRAMUSCULAR | Status: DC | PRN

## 2017-08-19 MED ORDER — LACTATED RINGERS IV SOLN
500.0000 mL | INTRAVENOUS | Status: DC | PRN
  Administered 2017-08-20: 500 mL via INTRAVENOUS

## 2017-08-19 MED ORDER — FENTANYL 2.5 MCG/ML BUPIVACAINE 1/10 % EPIDURAL INFUSION (WH - ANES)
14.0000 mL/h | INTRAMUSCULAR | Status: DC | PRN
  Administered 2017-08-20 (×3): 14 mL/h via EPIDURAL
  Filled 2017-08-19 (×3): qty 100

## 2017-08-19 MED ORDER — TERBUTALINE SULFATE 1 MG/ML IJ SOLN: 0.2500 mg | Freq: Once | INTRAMUSCULAR | Status: DC | PRN

## 2017-08-19 MED ORDER — SODIUM CHLORIDE 0.9 % IV SOLN: 250.0000 mL | INTRAVENOUS | Status: DC | PRN

## 2017-08-19 MED ORDER — DIPHENHYDRAMINE HCL 50 MG/ML IJ SOLN: 12.5000 mg | INTRAMUSCULAR | Status: DC | PRN

## 2017-08-19 MED ORDER — SOD CITRATE-CITRIC ACID 500-334 MG/5ML PO SOLN
30.0000 mL | ORAL | Status: DC | PRN
  Administered 2017-08-20: 30 mL via ORAL
  Filled 2017-08-19: qty 15

## 2017-08-19 MED ORDER — ACETAMINOPHEN 325 MG PO TABS: 650.0000 mg | ORAL_TABLET | ORAL | Status: DC | PRN

## 2017-08-19 MED ORDER — EPHEDRINE 5 MG/ML INJ
10.0000 mg | INTRAVENOUS | Status: DC | PRN
Start: 2017-08-19 — End: 2017-08-20

## 2017-08-19 MED ORDER — ONDANSETRON HCL 4 MG/2ML IJ SOLN
4.0000 mg | Freq: Four times a day (QID) | INTRAMUSCULAR | Status: DC | PRN
  Administered 2017-08-20: 4 mg via INTRAVENOUS
  Filled 2017-08-19: qty 2

## 2017-08-19 MED ORDER — TERBUTALINE SULFATE 1 MG/ML IJ SOLN
0.2500 mg | Freq: Once | INTRAMUSCULAR | Status: DC | PRN
Start: 2017-08-19 — End: 2017-08-20

## 2017-08-19 MED ORDER — OXYCODONE-ACETAMINOPHEN 5-325 MG PO TABS: 2.0000 | ORAL_TABLET | ORAL | Status: DC | PRN

## 2017-08-19 MED ORDER — OXYTOCIN BOLUS FROM INFUSION: 500.0000 mL | Freq: Once | INTRAVENOUS | Status: DC

## 2017-08-19 MED ORDER — LIDOCAINE HCL (PF) 1 % IJ SOLN: 30.0000 mL | INTRAMUSCULAR | Status: DC | PRN

## 2017-08-19 MED ORDER — OXYCODONE-ACETAMINOPHEN 5-325 MG PO TABS: 1.0000 | ORAL_TABLET | ORAL | Status: DC | PRN

## 2017-08-19 MED ORDER — LACTATED RINGERS IV SOLN
INTRAVENOUS | Status: DC
  Administered 2017-08-19 – 2017-08-20 (×4): via INTRAVENOUS

## 2017-08-19 MED ORDER — MISOPROSTOL 25 MCG QUARTER TABLET
25.0000 ug | ORAL_TABLET | ORAL | Status: DC | PRN
  Administered 2017-08-19 (×2): 25 ug via VAGINAL
  Filled 2017-08-19 (×2): qty 1

## 2017-08-19 NOTE — Plan of Care (Signed)
Patient is ambulating without any difficulty. Patient denies any pain or discomfort at this time. Patient is voiding and passing stool.

## 2017-08-19 NOTE — Progress Notes (Signed)
Strip reviewed and reassurring. Uc's irregular Cervical Ripening ongoing

## 2017-08-19 NOTE — Plan of Care (Signed)
Patient is progressing as expected. 

## 2017-08-19 NOTE — Anesthesia Pain Management Evaluation Note (Signed)
  [facility] Pain Management Visit Note  Patient: Doris GreenJamie D Hainer, 32 y.o., female  "Hello I am a member of the anesthesia team at [facility]. We have an anesthesia team available at all times to provide care throughout the hospital, including epidural management and anesthesia for C-section. I don't know your plan for the delivery whether it a natural birth, water birth, IV sedation, nitrous supplementation, doula or epidural, but we want to meet your pain goals."   1.Was your pain managed to your expectations on prior hospitalizations?   No prior hospitalizations  2.What is your expectation for pain management during this hospitalization?     Nitrous Oxide  3.How can we help you reach that goal?   Record the patient's initial score and the patient's pain goal.   Pain: 0  Pain Goal: 7 [facility] wants you to be able to say your pain was always managed very well.  Laban EmperorMalinova,Kemyra August Hristova 08/19/2017

## 2017-08-19 NOTE — Progress Notes (Signed)
Subjective: Doing well. Contracting irregular. Pt states they are getting stronger. Consented to foley bulb placement. Foley Bulb placed with speculum.  Discussed Pitocin being started after foley bulb out. Risks and benefits discussed. FHT's reassuring  Objective: BP 129/77   Pulse 87   Temp 97.6 F (36.4 C) (Oral)   Resp 20   Ht 5\' 4"  (1.626 m)   Wt 257 lb (116.6 kg)   LMP 11/13/2016 (Approximate)   SpO2 100%   BMI 44.11 kg/m  I/O last 3 completed shifts: In: 118 [P.O.:118] Out: -  No intake/output data recorded.  FHT: Category 1 UC:   irregular, every 2-3 minutes SVE:   Dilation: 1.5 Effacement (%): 70 Station: -2 Exam by:: L. Clemmons, CNM  Assessment:  Cervical Ripening/ Foley bulb induction Polyhydramnios  Plan: Start Pitocin after foley bulb out Nitrous /Epidural Prn Anticipate vaginal delivery Lori A Clemmons CNM, [place] 08/19/2017, 9:16 PM

## 2017-08-19 NOTE — Progress Notes (Signed)
Hospital day # 1 pregnancy at [redacted]w[redacted]d--Admitted for observation due to BPP 6/8, AFI 31  S:  Doing well, still anxious, but rested reasonably well.       Perception of contractions: None      Vaginal bleeding: None      Vaginal discharge:  None  O: BP 109/63 (BP Location: Right Arm)   Pulse 90   Temp 97.9 F (36.6 C) (Oral)   Resp 18   Ht 5\' 4"  (1.626 m)   Wt 116.9 kg (257 lb 12 oz)   LMP 11/13/2016 (Approximate)   SpO2 100%   BMI 44.24 kg/m    Vitals:   08/18/17 1623 08/18/17 1921 08/18/17 2314 08/19/17 0755  BP: 127/72 120/80 (!) 106/52 109/63  Pulse: 89 85 79 90  Resp: 18 18 16 18   Temp: 98.6 F (37 C) 97.7 F (36.5 C) 97.7 F (36.5 C) 97.9 F (36.6 C)  TempSrc: Oral Oral Oral Oral  SpO2: 99% 100% 100% 100%  Weight:      Height:            Fetal tracings:  Reactive NST on TID monitoring      Contractions:   None      Uterus non-tender      Extremities: no significant edema and no signs of DVT          Labs:   Results for orders placed or performed during the hospital encounter of 08/18/17 (from the past 24 hour(s))  Protein / creatinine ratio, urine     Status: None   Collection Time: 08/18/17 11:00 AM  Result Value Ref Range   Creatinine, Urine 59.00 mg/dL   Total Protein, Urine 6 mg/dL   Protein Creatinine Ratio 0.10 0.00 - 0.15 mg/mg[Cre]  CBC     Status: Abnormal   Collection Time: 08/18/17 11:01 AM  Result Value Ref Range   WBC 12.8 (H) 4.0 - 10.5 K/uL   RBC 4.63 3.87 - 5.11 MIL/uL   Hemoglobin 12.8 12.0 - 15.0 g/dL   HCT 40.9 81.1 - 91.4 %   MCV 82.7 78.0 - 100.0 fL   MCH 27.6 26.0 - 34.0 pg   MCHC 33.4 30.0 - 36.0 g/dL   RDW 78.2 95.6 - 21.3 %   Platelets 154 150 - 400 K/uL  Comprehensive metabolic panel     Status: Abnormal   Collection Time: 08/18/17 11:01 AM  Result Value Ref Range   Sodium 134 (L) 135 - 145 mmol/L   Potassium 4.3 3.5 - 5.1 mmol/L   Chloride 105 101 - 111 mmol/L   CO2 22 22 - 32 mmol/L   Glucose, Bld 74 65 - 99 mg/dL   BUN  7 6 - 20 mg/dL   Creatinine, Ser 0.86 0.44 - 1.00 mg/dL   Calcium 8.9 8.9 - 57.8 mg/dL   Total Protein 6.6 6.5 - 8.1 g/dL   Albumin 2.7 (L) 3.5 - 5.0 g/dL   AST 22 15 - 41 U/L   ALT 15 14 - 54 U/L   Alkaline Phosphatase 162 (H) 38 - 126 U/L   Total Bilirubin 0.1 (L) 0.3 - 1.2 mg/dL   GFR calc non Af Amer >60 >60 mL/min   GFR calc Af Amer >60 >60 mL/min   Anion gap 7 5 - 15  Lactate dehydrogenase     Status: Abnormal   Collection Time: 08/18/17 11:01 AM  Result Value Ref Range   LDH 218 (H) 98 - 192 U/L  Uric acid  Status: None   Collection Time: 08/18/17 11:01 AM  Result Value Ref Range   Uric Acid, Serum 4.4 2.3 - 6.6 mg/dL  Type and screen [facility]     Status: None   Collection Time: 08/19/17  5:23 AM  Result Value Ref Range   ABO/RH(D) A POS    Antibody Screen NEG    Sample Expiration      08/22/2017 Performed at [facility], [address] [place], [place] [address]   CBC     Status: Abnormal   Collection Time: 08/19/17  5:23 AM  Result Value Ref Range   WBC 9.7 4.0 - 10.5 K/uL   RBC 4.35 3.87 - 5.11 MIL/uL   Hemoglobin 12.1 12.0 - 15.0 g/dL   HCT 60.436.1 54.036.0 - 98.146.0 %   MCV 83.0 78.0 - 100.0 fL   MCH 27.8 26.0 - 34.0 pg   MCHC 33.5 30.0 - 36.0 g/dL   RDW 19.113.9 47.811.5 - 29.515.5 %   Platelets 140 (L) 150 - 400 K/uL  Comprehensive metabolic panel     Status: Abnormal   Collection Time: 08/19/17  5:23 AM  Result Value Ref Range   Sodium 135 135 - 145 mmol/L   Potassium 3.9 3.5 - 5.1 mmol/L   Chloride 105 101 - 111 mmol/L   CO2 21 (L) 22 - 32 mmol/L   Glucose, Bld 79 65 - 99 mg/dL   BUN 7 6 - 20 mg/dL   Creatinine, Ser 6.210.65 0.44 - 1.00 mg/dL   Calcium 8.4 (L) 8.9 - 10.3 mg/dL   Total Protein 5.7 (L) 6.5 - 8.1 g/dL   Albumin 2.4 (L) 3.5 - 5.0 g/dL   AST 17 15 - 41 U/L   ALT 14 14 - 54 U/L   Alkaline Phosphatase 143 (H) 38 - 126 U/L   Total Bilirubin 0.3 0.3 - 1.2 mg/dL   GFR calc non Af Amer >60 >60 mL/min   GFR calc Af Amer >60 >60  mL/min   Anion gap 9 5 - 15         Meds:  . docusate sodium  100 mg Oral Daily  . prenatal multivitamin  1 tablet Oral Q1200    A:  IUP at 39 6/7 weeks      BPP 6/8 yesterday      Polyhydramnios--AFI 31     Stable  P: Continue current plan of care      Upcoming tests/treatments:  BPP today      Per consult with Dr. Estanislado Pandyivard, if BPP WNL, will d/c              home, with plan to keep scheduled f/u visit with Dr.      Dion BodyVarnado tomorrow.   Nigel BridgemanVicki Emerald Shor CNM, [place] 08/19/2017 8:34 AM

## 2017-08-19 NOTE — Progress Notes (Signed)
  Subjective: Now on Birthing Suite--accepting of plan of care for induction due to polyhydramnios.  Still quite anxious, but coping.  Objective: BP 111/64   Pulse 88   Temp 98 F (36.7 C) (Oral)   Resp 18   Ht 5\' 4"  (1.626 m)   Wt 116.6 kg (257 lb)   LMP 11/13/2016 (Approximate)   SpO2 100%   BMI 44.11 kg/m  No intake/output data recorded. Total I/O In: 118 [P.O.:118] Out: -    EFW   FHT: Category 1 UC:   none SVE:   Dilation: 1 Effacement (%): 50 Exam by:: VLatham,cnm Cytotech 25 mcg placed per vagina at 12:35p  Assessment:  IUP at 39 6/7 weeks Polyhydramnios GBS negative  Plan: Continue cervical ripening. Support to patient for anxieties.  Nigel BridgemanVicki Soleia Badolato CNM 08/19/2017, 1:49 PM

## 2017-08-19 NOTE — Progress Notes (Signed)
  Subjective: Feeling some cramping, occasional more defined UCs.    Objective: BP 118/72   Pulse 96   Temp 97.8 F (36.6 C) (Oral)   Resp 18   Ht 5\' 4"  (1.626 m)   Wt 116.6 kg (257 lb)   LMP 11/13/2016 (Approximate)   SpO2 100%   BMI 44.11 kg/m  No intake/output data recorded. Total I/O In: 118 [P.O.:118] Out: -   FHT: Category 1 UC:   Uterine irritability, occasional mild UCs SVE:   Dilation: 1 Effacement (%): 50 Station: -2 Exam by:: VLatham,cnm  Small amount bloody show noted with exam. Cytotech #2 placed in posterior fornix.  Assessment:  IUP at 39 6/7 weeks Induction for polyhydramnios GBS negative  Plan: Continue current care. Re-evaluate in 4 hours or prn. Dr. Dion BodyVarnado aware of patient's admission, will assume care at 7am tomorrow. Patient under [facility] care until then.  Nigel BridgemanVicki Phoebie Shad CNM 08/19/2017, 5:08 PM

## 2017-08-19 NOTE — Progress Notes (Signed)
Returned from BPP:  8/8, AFI 29.94, vtx. Dr. Ezzard StandingNewman, MFM who reviewed US, made recommendation for delivery due to polyhydramnios at [redacted] weeks EGA.  Reviewed results/recommendation with Dr. Estanislado Pandyivard. Due to MFM making that recommendation, will transfer to L&D for initiation of induction.  Results and plan of care reviewed with patient and husband.  Risks and benefits of induction were reviewed, including failure of method, prolonged labor, need for further intervention, risk of cesarean.   Options of cytotech, foley bulb, AROM, and pitocin reviewed, with use of each discussed Patient and husband seem to understand these risks and wish to proceed.  Plan cytotech as initial measure.  Doris BridgemanVicki Latrece Martinez, CNM 08/19/17 11:36a

## 2017-08-20 ENCOUNTER — Inpatient Hospital Stay (HOSPITAL_COMMUNITY): Payer: BC Managed Care – PPO | Admitting: Anesthesiology

## 2017-08-20 ENCOUNTER — Encounter (HOSPITAL_COMMUNITY)
Admission: AD | Disposition: A | Discharge status: Home / Self Care | Payer: Self-pay | Source: Ambulatory Visit | Attending: Obstetrics and Gynecology

## 2017-08-20 LAB — CBC
HCT: 35.3 % — ABNORMAL LOW (ref 36.0–46.0)
HCT: 34.5 % — ABNORMAL LOW (ref 36.0–46.0)
HEMOGLOBIN: 11.9 g/dL — AB (ref 12.0–15.0)
Hemoglobin: 11.7 g/dL — ABNORMAL LOW (ref 12.0–15.0)
MCH: 27.6 pg (ref 26.0–34.0)
MCH: 27.7 pg (ref 26.0–34.0)
MCHC: 33.7 g/dL (ref 30.0–36.0)
MCHC: 33.9 g/dL (ref 30.0–36.0)
MCV: 81.9 fL (ref 78.0–100.0)
MCV: 81.6 fL (ref 78.0–100.0)
PLATELETS: 126 10*3/uL — AB (ref 150–400)
Platelets: 145 10*3/uL — ABNORMAL LOW (ref 150–400)
RBC: 4.31 MIL/uL (ref 3.87–5.11)
RBC: 4.23 MIL/uL (ref 3.87–5.11)
RDW: 14.1 % (ref 11.5–15.5)
RDW: 14.1 % (ref 11.5–15.5)
WBC: 13.5 10*3/uL — ABNORMAL HIGH (ref 4.0–10.5)
WBC: 17.9 10*3/uL — ABNORMAL HIGH (ref 4.0–10.5)

## 2017-08-20 LAB — RPR: RPR Ser Ql: NONREACTIVE

## 2017-08-20 SURGERY — Surgical Case
Anesthesia: Epidural | Site: Abdomen | Wound class: Clean Contaminated

## 2017-08-20 MED ORDER — ZOLPIDEM TARTRATE 5 MG PO TABS: 5.0000 mg | ORAL_TABLET | Freq: Every evening | ORAL | Status: DC | PRN

## 2017-08-20 MED ORDER — METOCLOPRAMIDE HCL 5 MG/ML IJ SOLN: 10.0000 mg | Freq: Once | INTRAMUSCULAR | Status: DC | PRN

## 2017-08-20 MED ORDER — OXYTOCIN 10 UNIT/ML IJ SOLN
INTRAVENOUS | Status: DC | PRN
  Administered 2017-08-20: 40 [IU] via INTRAVENOUS

## 2017-08-20 MED ORDER — MEPERIDINE HCL 25 MG/ML IJ SOLN: 6.2500 mg | INTRAMUSCULAR | Status: DC | PRN

## 2017-08-20 MED ORDER — METHYLERGONOVINE MALEATE 0.2 MG PO TABS: 0.2000 mg | ORAL_TABLET | ORAL | Status: DC | PRN

## 2017-08-20 MED ORDER — SIMETHICONE 80 MG PO CHEW
80.0000 mg | CHEWABLE_TABLET | ORAL | Status: DC
  Administered 2017-08-21: 80 mg via ORAL
  Filled 2017-08-20 (×2): qty 1

## 2017-08-20 MED ORDER — OXYTOCIN 40 UNITS IN LACTATED RINGERS INFUSION - SIMPLE MED: 1.0000 m[IU]/min | INTRAVENOUS | Status: DC

## 2017-08-20 MED ORDER — WITCH HAZEL-GLYCERIN EX PADS
1.0000 | MEDICATED_PAD | CUTANEOUS | Status: DC | PRN
Start: 2017-08-20 — End: 2017-08-23

## 2017-08-20 MED ORDER — SENNOSIDES-DOCUSATE SODIUM 8.6-50 MG PO TABS
2.0000 | ORAL_TABLET | ORAL | Status: DC
  Administered 2017-08-21 – 2017-08-23 (×2): 2 via ORAL
  Filled 2017-08-20 (×2): qty 2

## 2017-08-20 MED ORDER — SIMETHICONE 80 MG PO CHEW
80.0000 mg | CHEWABLE_TABLET | ORAL | Status: DC | PRN
  Administered 2017-08-23: 80 mg via ORAL

## 2017-08-20 MED ORDER — PRENATAL MULTIVITAMIN CH
1.0000 | ORAL_TABLET | Freq: Every day | ORAL | Status: DC
  Administered 2017-08-21: 1 via ORAL
  Filled 2017-08-20 (×3): qty 1

## 2017-08-20 MED ORDER — ONDANSETRON HCL 4 MG/2ML IJ SOLN
INTRAMUSCULAR | Status: DC | PRN
  Administered 2017-08-20: 4 mg via INTRAVENOUS

## 2017-08-20 MED ORDER — DEXAMETHASONE SODIUM PHOSPHATE 4 MG/ML IJ SOLN
INTRAMUSCULAR | Status: AC
  Filled 2017-08-20: qty 1

## 2017-08-20 MED ORDER — FENTANYL CITRATE (PF) 100 MCG/2ML IJ SOLN
25.0000 ug | INTRAMUSCULAR | Status: DC | PRN
  Administered 2017-08-20 (×2): 50 ug via INTRAVENOUS

## 2017-08-20 MED ORDER — LACTATED RINGERS IV SOLN
INTRAVENOUS | Status: DC | PRN
  Administered 2017-08-20 (×2): via INTRAVENOUS

## 2017-08-20 MED ORDER — OXYCODONE HCL 5 MG PO TABS
5.0000 mg | ORAL_TABLET | ORAL | Status: DC | PRN
  Administered 2017-08-21 – 2017-08-23 (×3): 5 mg via ORAL
  Filled 2017-08-20 (×3): qty 1

## 2017-08-20 MED ORDER — SCOPOLAMINE 1 MG/3DAYS TD PT72
MEDICATED_PATCH | TRANSDERMAL | Status: AC
  Filled 2017-08-20: qty 1

## 2017-08-20 MED ORDER — SIMETHICONE 80 MG PO CHEW
80.0000 mg | CHEWABLE_TABLET | Freq: Three times a day (TID) | ORAL | Status: DC
  Administered 2017-08-21 – 2017-08-23 (×6): 80 mg via ORAL
  Filled 2017-08-20 (×6): qty 1

## 2017-08-20 MED ORDER — ACETAMINOPHEN 325 MG PO TABS
ORAL_TABLET | ORAL | Status: AC
  Administered 2017-08-20: 650 mg via ORAL
  Filled 2017-08-20: qty 2

## 2017-08-20 MED ORDER — OXYTOCIN 10 UNIT/ML IJ SOLN
INTRAMUSCULAR | Status: AC
  Filled 2017-08-20: qty 4

## 2017-08-20 MED ORDER — PHENYLEPHRINE HCL 10 MG/ML IJ SOLN
INTRAMUSCULAR | Status: DC | PRN
  Administered 2017-08-20 (×2): 80 ug via INTRAVENOUS

## 2017-08-20 MED ORDER — TETANUS-DIPHTH-ACELL PERTUSSIS 5-2.5-18.5 LF-MCG/0.5 IM SUSP: 0.5000 mL | Freq: Once | INTRAMUSCULAR | Status: DC

## 2017-08-20 MED ORDER — FENTANYL CITRATE (PF) 100 MCG/2ML IJ SOLN
INTRAMUSCULAR | Status: DC | PRN
  Administered 2017-08-20: 100 ug via EPIDURAL

## 2017-08-20 MED ORDER — COCONUT OIL OIL: 1.0000 "application " | TOPICAL_OIL | Status: DC | PRN

## 2017-08-20 MED ORDER — DIBUCAINE 1 % RE OINT
1.0000 | TOPICAL_OINTMENT | RECTAL | Status: DC | PRN
Start: 2017-08-20 — End: 2017-08-23

## 2017-08-20 MED ORDER — SCOPOLAMINE 1 MG/3DAYS TD PT72
MEDICATED_PATCH | TRANSDERMAL | Status: DC | PRN
  Administered 2017-08-20: 1 via TRANSDERMAL

## 2017-08-20 MED ORDER — MENTHOL 3 MG MT LOZG: 1.0000 | LOZENGE | OROMUCOSAL | Status: DC | PRN

## 2017-08-20 MED ORDER — MORPHINE SULFATE (PF) 0.5 MG/ML IJ SOLN
INTRAMUSCULAR | Status: AC
  Filled 2017-08-20: qty 10

## 2017-08-20 MED ORDER — OXYTOCIN 40 UNITS IN LACTATED RINGERS INFUSION - SIMPLE MED: 2.5000 [IU]/h | INTRAVENOUS | Status: AC

## 2017-08-20 MED ORDER — DEXAMETHASONE SODIUM PHOSPHATE 4 MG/ML IJ SOLN
INTRAMUSCULAR | Status: DC | PRN
  Administered 2017-08-20: 4 mg via INTRAVENOUS

## 2017-08-20 MED ORDER — MORPHINE SULFATE (PF) 0.5 MG/ML IJ SOLN
INTRAMUSCULAR | Status: DC | PRN
  Administered 2017-08-20: 4 mg via EPIDURAL

## 2017-08-20 MED ORDER — ONDANSETRON HCL 4 MG/2ML IJ SOLN
INTRAMUSCULAR | Status: AC
  Filled 2017-08-20: qty 4

## 2017-08-20 MED ORDER — METHYLERGONOVINE MALEATE 0.2 MG/ML IJ SOLN: 0.2000 mg | INTRAMUSCULAR | Status: DC | PRN

## 2017-08-20 MED ORDER — ACETAMINOPHEN 325 MG PO TABS
650.0000 mg | ORAL_TABLET | ORAL | Status: DC | PRN
Start: 2017-08-20 — End: 2017-08-23
  Administered 2017-08-20 – 2017-08-23 (×6): 650 mg via ORAL
  Filled 2017-08-20 (×5): qty 2

## 2017-08-20 MED ORDER — FENTANYL CITRATE (PF) 100 MCG/2ML IJ SOLN
INTRAMUSCULAR | Status: AC
  Filled 2017-08-20: qty 2

## 2017-08-20 MED ORDER — LIDOCAINE-EPINEPHRINE (PF) 2 %-1:200000 IJ SOLN
INTRAMUSCULAR | Status: DC | PRN
  Administered 2017-08-20: 10 mL

## 2017-08-20 MED ORDER — DIPHENHYDRAMINE HCL 25 MG PO CAPS: 25.0000 mg | ORAL_CAPSULE | Freq: Four times a day (QID) | ORAL | Status: DC | PRN

## 2017-08-20 MED ORDER — FENTANYL CITRATE (PF) 100 MCG/2ML IJ SOLN
INTRAMUSCULAR | Status: AC
  Administered 2017-08-20: 50 ug via INTRAVENOUS
  Filled 2017-08-20: qty 2

## 2017-08-20 MED ORDER — SODIUM CHLORIDE 0.9 % IR SOLN
Status: DC | PRN
  Administered 2017-08-20 (×3): 1

## 2017-08-20 MED ORDER — CEFAZOLIN SODIUM-DEXTROSE 2-3 GM-%(50ML) IV SOLR
INTRAVENOUS | Status: DC | PRN
  Administered 2017-08-20: 2 g via INTRAVENOUS

## 2017-08-20 MED ORDER — LIDOCAINE HCL (PF) 1 % IJ SOLN
INTRAMUSCULAR | Status: DC | PRN
  Administered 2017-08-20 (×2): 5 mL via EPIDURAL

## 2017-08-20 MED ORDER — SODIUM BICARBONATE 8.4 % IV SOLN
INTRAVENOUS | Status: DC | PRN
  Administered 2017-08-20 (×4): 5 mL via EPIDURAL

## 2017-08-20 MED ORDER — LACTATED RINGERS IV SOLN
INTRAVENOUS | Status: DC | PRN
  Administered 2017-08-20: 19:00:00 via INTRAVENOUS

## 2017-08-20 MED ORDER — LACTATED RINGERS IV SOLN
INTRAVENOUS | Status: DC
  Administered 2017-08-21: via INTRAVENOUS

## 2017-08-20 MED ORDER — OXYCODONE HCL 5 MG PO TABS: 10.0000 mg | ORAL_TABLET | ORAL | Status: DC | PRN

## 2017-08-20 MED ORDER — SODIUM BICARBONATE 8.4 % IV SOLN
INTRAVENOUS | Status: AC
  Filled 2017-08-20: qty 50

## 2017-08-20 MED ORDER — CEFAZOLIN SODIUM-DEXTROSE 2-4 GM/100ML-% IV SOLN
INTRAVENOUS | Status: AC
  Filled 2017-08-20: qty 100

## 2017-08-20 MED ORDER — LIDOCAINE-EPINEPHRINE (PF) 2 %-1:200000 IJ SOLN
INTRAMUSCULAR | Status: AC
  Filled 2017-08-20: qty 20

## 2017-08-20 MED ORDER — IBUPROFEN 600 MG PO TABS
600.0000 mg | ORAL_TABLET | Freq: Four times a day (QID) | ORAL | Status: DC
  Administered 2017-08-21 – 2017-08-23 (×9): 600 mg via ORAL
  Filled 2017-08-20 (×10): qty 1

## 2017-08-20 SURGICAL SUPPLY — 40 items
ADH SKN CLS APL DERMABOND .7 (GAUZE/BANDAGES/DRESSINGS)
APL SKNCLS STERI-STRIP NONHPOA (GAUZE/BANDAGES/DRESSINGS) ×1
BARRIER ADHS 3X4 INTERCEED (GAUZE/BANDAGES/DRESSINGS) ×3 IMPLANT
BENZOIN TINCTURE PRP APPL 2/3 (GAUZE/BANDAGES/DRESSINGS) ×3 IMPLANT
BRR ADH 4X3 ABS CNTRL BYND (GAUZE/BANDAGES/DRESSINGS) ×1
CHLORAPREP W/TINT 26ML (MISCELLANEOUS) ×3 IMPLANT
CLAMP CORD UMBIL (MISCELLANEOUS) IMPLANT
CLOSURE STERI STRIP 1/2 X4 (GAUZE/BANDAGES/DRESSINGS) ×3 IMPLANT
CLOSURE WOUND 1/2 X4 (GAUZE/BANDAGES/DRESSINGS)
CLOTH BEACON ORANGE TIMEOUT ST (SAFETY) ×3 IMPLANT
DERMABOND ADVANCED (GAUZE/BANDAGES/DRESSINGS)
DERMABOND ADVANCED .7 DNX12 (GAUZE/BANDAGES/DRESSINGS) IMPLANT
DRSG OPSITE POSTOP 4X10 (GAUZE/BANDAGES/DRESSINGS) ×3 IMPLANT
ELECT REM PT RETURN 9FT ADLT (ELECTROSURGICAL) ×3
ELECTRODE REM PT RTRN 9FT ADLT (ELECTROSURGICAL) ×1 IMPLANT
EXTRACTOR VACUUM BELL STYLE (SUCTIONS) IMPLANT
GAUZE SPONGE 4X4 12PLY STRL LF (GAUZE/BANDAGES/DRESSINGS) ×6 IMPLANT
GLOVE BIO SURGEON STRL SZ7 (GLOVE) ×3 IMPLANT
GLOVE BIOGEL PI IND STRL 7.0 (GLOVE) ×2 IMPLANT
GLOVE BIOGEL PI INDICATOR 7.0 (GLOVE) ×4
GOWN STRL REUS W/TWL LRG LVL3 (GOWN DISPOSABLE) ×6 IMPLANT
KIT ABG SYR 3ML LUER SLIP (SYRINGE) IMPLANT
NEEDLE HYPO 25X5/8 SAFETYGLIDE (NEEDLE) IMPLANT
NS IRRIG 1000ML POUR BTL (IV SOLUTION) ×3 IMPLANT
PACK C SECTION WH (CUSTOM PROCEDURE TRAY) ×3 IMPLANT
PAD ABD 7.5X8 STRL (GAUZE/BANDAGES/DRESSINGS) ×3 IMPLANT
PAD OB MATERNITY 4.3X12.25 (PERSONAL CARE ITEMS) ×6 IMPLANT
PENCIL SMOKE EVAC W/HOLSTER (ELECTROSURGICAL) ×3 IMPLANT
RTRCTR C-SECT PINK 25CM LRG (MISCELLANEOUS) ×3 IMPLANT
STRIP CLOSURE SKIN 1/2X4 (GAUZE/BANDAGES/DRESSINGS) IMPLANT
SUT CHROMIC 0 CTX 36 (SUTURE) IMPLANT
SUT MON AB 4-0 PS1 27 (SUTURE) ×3 IMPLANT
SUT PLAIN 0 NONE (SUTURE) IMPLANT
SUT PLAIN 2 0 XLH (SUTURE) ×3 IMPLANT
SUT VIC AB 0 CTX 36 (SUTURE) ×10
SUT VIC AB 0 CTX36XBRD ANBCTRL (SUTURE) ×5 IMPLANT
SUT VIC AB 2-0 CT1 27 (SUTURE) ×3
SUT VIC AB 2-0 CT1 TAPERPNT 27 (SUTURE) ×1 IMPLANT
TOWEL OR 17X24 6PK STRL BLUE (TOWEL DISPOSABLE) ×3 IMPLANT
TRAY FOLEY BAG SILVER LF 14FR (SET/KITS/TRAYS/PACK) IMPLANT

## 2017-08-20 NOTE — Progress Notes (Signed)
Doris Martinez is a 32 y.o. G1P0 at 7531w0d   Subjective: Pt has been comfortable with epidural.  Objective: BP 96/60   Pulse (!) 104   Temp 98 F (36.7 C) (Oral)   Resp 20   Ht 5\' 4"  (1.626 m)   Wt 116.6 kg (257 lb)   LMP 11/13/2016 (Approximate)   SpO2 100%   BMI 44.11 kg/m  I/O last 3 completed shifts: In: 118 [P.O.:118] Out: 550 [Urine:550] Total I/O In: -  Out: 1000 [Urine:1000]  FHT:  FHR: 130-140s bpm, variability: moderate,  accelerations:  Present,  decelerations:  Absent FHR 130s UC:   irregular, every 1-3 minutes SVE:   Dilation: 5 Effacement (%): 80 Station: -1 Exam by:: sowder 4/80/-2, stretch to 5 cm, softer  IUPC removed and reinserted  Ptiocin 13 mUs per RN. Labs: Lab Results  Component Value Date   WBC 13.5 (H) 08/20/2017   HGB 11.9 (L) 08/20/2017   HCT 35.3 (L) 08/20/2017   MCV 81.9 08/20/2017   PLT 145 (L) 08/20/2017    Assessment / Plan: IUP @ 40 0/7 weeks IOL due to Polyhydramnios Mild thrombocytopenia, normal LFTs.  Labor: No change, contractions inadequate.  Hold Pitocin for 1 hour for washout then resume Preeclampsia:  BP normal. Fetal Wellbeing:  Category I Pain Control:  Epidural I/D:  n/a Anticipated MOD:  NSVD, if contractions can become adequate.  Discussed possible cesarean section if no change and unable to increase contractions.  Doris Martinez 08/20/2017, 1:51 PM

## 2017-08-20 NOTE — Brief Op Note (Signed)
08/20/2017  8:25 PM  PATIENT:  Doris Martinez  32 y.o. female  PRE-OPERATIVE DIAGNOSIS:  IUP @ 40 0/7 weeks, Polyhydramnios,  failed induction/ Failure to progress  POST-OPERATIVE DIAGNOSIS:  Primary Cesarean section for failed induction/ Failure to progress  PROCEDURE:  Procedure(s): CESAREAN SECTION (N/A), Primary, LTCS 2 layer closure  SURGEON:  Surgeon(s) and Role:    Geryl Rankins* Anet Logsdon, MD - Primary  PHYSICIAN ASSISTANT:   ASSISTANTS: Illene BolusLori Clemmons, CMA   ANESTHESIA:   epidural  EBL:  900 mL   BLOOD ADMINISTERED:none  DRAINS: Urinary Catheter (Foley)   LOCAL MEDICATIONS USED:  NONE  SPECIMEN:  No Specimen  DISPOSITION OF SPECIMEN:  n/a  COUNTS:  YES  TOURNIQUET:  * No tourniquets in log *  DICTATION: .Other Dictation: Dictation Number [id number]  PLAN OF CARE: Transfer to postpartum  PATIENT DISPOSITION:  PACU - hemodynamically stable.   Delay start of Pharmacological VTE agent (>24hrs) due to surgical blood loss or risk of bleeding: yes

## 2017-08-20 NOTE — Progress Notes (Signed)
Subjective: Pt is comfortable with epidural; SROM clear fluid moderate amount  Objective: BP 112/69   Pulse 89   Temp 98.2 F (36.8 C) (Oral)   Resp 18   Ht 5\' 4"  (1.626 m)   Wt 257 lb (116.6 kg)   LMP 11/13/2016 (Approximate)   SpO2 100%   BMI 44.11 kg/m  I/O last 3 completed shifts: In: 118 [P.O.:118] Out: -  No intake/output data recorded.  FHT: Category 1 UC:   regular, every 2 minutes SVE:   Dilation: 4 Effacement (%): 70 Station: -2 Exam by:: L. Clemmons, CNM Pitocin at 4   Assessment:  Latent labor  Plan: Continue pitocin Anticipate vaginal delivery Dr Dion BodyVarnado on @ [address]  Lori A Clemmons CNM, [place] 08/20/2017, 5:19 AM

## 2017-08-20 NOTE — Transfer of Care (Signed)
Immediate Anesthesia Transfer of Care Note  Patient: Doris Martinez  Procedure(s) Performed: CESAREAN SECTION (N/A Abdomen)  Patient Location: PACU  Anesthesia Type:Epidural  Level of Consciousness: awake, alert  and oriented  Airway & Oxygen Therapy: Patient Spontanous Breathing  Post-op Assessment: Report given to RN, Post -op Vital signs reviewed and stable and Patient moving all extremities X 4  Post vital signs: Reviewed and stable  Last Vitals:  Vitals:   08/20/17 1833 08/20/17 1839  BP: (!) 145/80 132/68  Pulse: (!) 102 (!) 125  Resp:    Temp:    SpO2:      Last Pain:  Vitals:   08/20/17 1701  TempSrc:   PainSc: 1          Complications: No apparent anesthesia complications

## 2017-08-20 NOTE — Progress Notes (Signed)
Strip reviewed. Cat 1

## 2017-08-20 NOTE — Anesthesia Procedure Notes (Signed)
Epidural Patient location during procedure: OB Start time: 08/20/2017 12:30 AM  Staffing Anesthesiologist: Leonides GrillsEllender, Genna Casimir P, MD Performed: anesthesiologist   Preanesthetic Checklist Completed: patient identified, site marked, pre-op evaluation, timeout performed, IV checked, risks and benefits discussed and monitors and equipment checked  Epidural Patient position: sitting Prep: DuraPrep Patient monitoring: heart rate, cardiac monitor, continuous pulse ox and blood pressure Approach: midline Location: L4-L5 Injection technique: LOR air  Needle:  Needle type: Tuohy  Needle gauge: 17 G Needle length: 9 cm Needle insertion depth: 8 cm Catheter type: closed end flexible Catheter size: 19 Gauge Catheter at skin depth: 13 cm Test dose: negative and Other  Assessment Events: blood not aspirated, injection not painful, no injection resistance and negative IV test  Additional Notes Informed consent obtained prior to proceeding including risk of failure, 1% risk of PDPH, risk of minor discomfort and bruising. Discussed alternatives to epidural analgesia and patient desires to proceed.  Timeout performed pre-procedure verifying patient name, procedure, and platelet count.  Patient tolerated procedure well. Reason for block:procedure for pain

## 2017-08-20 NOTE — Progress Notes (Signed)
Doris Martinez is a 32 y.o. G1P0 at 6774w0d   Subjective: Pt has been comfortable with epidural.  Objective: BP 138/78   Pulse 84   Temp 97.8 F (36.6 C) (Oral)   Resp 18   Ht 5\' 4"  (1.626 m)   Wt 116.6 kg (257 lb)   LMP 11/13/2016 (Approximate)   SpO2 100%   BMI 44.11 kg/m  I/O last 3 completed shifts: In: 118 [P.O.:118] Out: 550 [Urine:550] Total I/O In: -  Out: 2900 [Urine:2900]  FHT:  FHR: 130-140s bpm, variability: moderate,  accelerations:  Present,  decelerations:  Absent FHR 130s UC:   irregular, every 1-3 minutes SVE:   Dilation: 5 Effacement (%): 80 Station: -1 Exam by:: Doris Martinez 4/80/-2, stretch to 5 cm, softer  IUPC removed and reinserted  Ptiocin 13 mUs per RN. Labs: Lab Results  Component Value Date   WBC 13.5 (H) 08/20/2017   HGB 11.9 (L) 08/20/2017   HCT 35.3 (L) 08/20/2017   MCV 81.9 08/20/2017   PLT 145 (L) 08/20/2017    Assessment / Plan: IUP @ 40 0/7 weeks IOL due to Polyhydramnios Mild thrombocytopenia, normal LFTs.  Labor: No change, contractions inadequate.  Hold Pitocin for 1 hour for washout then resume Preeclampsia:  BP normal. Fetal Wellbeing:  Category I Pain Control:  Epidural I/D:  n/a Anticipated MOD:  NSVD, if contractions can become adequate.  Discussed possible cesarean section if no change and unable to increase contractions.  Doris Martinez 08/20/2017, 5:18 PM   Addendum:' No change, contractions have not improved.  Proceed with primary c-searean seciton due to failed induction, failure to progress.  Pt consented for primary c-section, reviewed R/B/A.  All questions are answered.  18:52.

## 2017-08-20 NOTE — Progress Notes (Signed)
Doris GreenJamie D Martinez is a 32 y.o. G1P0 at 2270w0d   Subjective: Pt has been comfortable with epidural but now has lower back pain. RN states pt is on 8 milliunits of Pitocin, being increased 1x1.  Objective: BP 119/74   Pulse 86   Temp 98.1 F (36.7 C) (Oral)   Resp 20   Ht 5\' 4"  (1.626 m)   Wt 116.6 kg (257 lb)   LMP 11/13/2016 (Approximate)   SpO2 100%   BMI 44.11 kg/m  I/O last 3 completed shifts: In: 118 [P.O.:118] Out: 550 [Urine:550] No intake/output data recorded.  FHT:  FHR: 130-140s bpm, variability: moderate,  accelerations:  Present,  decelerations:  Absent UC:   irregular, every 1-3 minutes SVE:   Dilation: 4 Effacement (%): 70 Station: -2 Exam by:: Doris Martinez, CNM My exam is unchanged.  IUPC placed without difficulty.  Labs: Lab Results  Component Value Date   WBC 13.5 (H) 08/20/2017   HGB 11.9 (L) 08/20/2017   HCT 35.3 (L) 08/20/2017   MCV 81.9 08/20/2017   PLT 145 (L) 08/20/2017    Assessment / Plan: IUP @ 40 0/7 weeks IOL due to Polyhydramnios Mild thrombocytopenia, normal LFTs.  Labor: S/p Cytotec x 2, Foley bulb.  Increase Pitocin 2x2. Preeclampsia:  BP normal. Fetal Wellbeing:  Category I Pain Control:  Epidural I/D:  n/a Anticipated MOD:  NSVD  Doris Martinez 08/20/2017, 8:15 AM

## 2017-08-20 NOTE — Anesthesia Postprocedure Evaluation (Signed)
Anesthesia Post Note  Patient: Doris Martinez  Procedure(s) Performed: CESAREAN SECTION (N/A Abdomen)     Patient location during evaluation: PACU Anesthesia Type: Epidural Level of consciousness: awake and alert and oriented Pain management: pain level controlled Vital Signs Assessment: post-procedure vital signs reviewed and stable Respiratory status: spontaneous breathing, nonlabored ventilation and respiratory function stable Cardiovascular status: blood pressure returned to baseline and stable Postop Assessment: no headache, no backache, epidural receding, patient able to bend at knees and no apparent nausea or vomiting Anesthetic complications: no    Last Vitals:  Vitals:   08/20/17 2200 08/20/17 2216  BP:  127/88  Pulse:  93  Resp:  18  Temp: 36.9 C 36.8 C  SpO2:  95%    Last Pain:  Vitals:   08/20/17 2216  TempSrc: Oral  PainSc:    Pain Goal:                 Celinda Dethlefs A.

## 2017-08-20 NOTE — Anesthesia Preprocedure Evaluation (Addendum)
Anesthesia Evaluation  Patient identified by MRN, date of birth, ID band Patient awake    Reviewed: Allergy & Precautions, H&P , NPO status , Patient's Chart, lab work & pertinent test results  History of Anesthesia Complications Negative for: history of anesthetic complications  Airway Mallampati: II  TM Distance: >3 FB Neck ROM: full    Dental no notable dental hx. (+) Teeth Intact   Pulmonary neg pulmonary ROS,    Pulmonary exam normal breath sounds clear to auscultation       Cardiovascular negative cardio ROS Normal cardiovascular exam Rhythm:regular Rate:Normal     Neuro/Psych  Headaches, PSYCHIATRIC DISORDERS Anxiety Depression ADD   GI/Hepatic negative GI ROS, Neg liver ROS,   Endo/Other  Morbid obesity  Renal/GU negative Renal ROS  negative genitourinary   Musculoskeletal   Abdominal (+) + obese,   Peds  Hematology negative hematology ROS (+)   Anesthesia Other Findings   Reproductive/Obstetrics (+) Pregnancy                             Anesthesia Physical Anesthesia Plan  ASA: III and emergent  Anesthesia Plan: Epidural   Post-op Pain Management:    Induction:   PONV Risk Score and Plan: 4 or greater and Scopolamine patch - Pre-op, Dexamethasone, Ondansetron and Treatment may vary due to age or medical condition  Airway Management Planned: Natural Airway  Additional Equipment:   Intra-op Plan:   Post-operative Plan:   Informed Consent: I have reviewed the patients History and Physical, chart, labs and discussed the procedure including the risks, benefits and alternatives for the proposed anesthesia with the patient or authorized representative who has indicated his/her understanding and acceptance.     Plan Discussed with: CRNA, Anesthesiologist and Surgeon  Anesthesia Plan Comments: (Patient for C/section for arrest of dilation/descent. Will use epidural for  C/section. M. Malen GauzeFoster, MD )       Anesthesia Quick Evaluation

## 2017-08-21 ENCOUNTER — Encounter (HOSPITAL_COMMUNITY): Payer: Self-pay | Admitting: Obstetrics and Gynecology

## 2017-08-21 LAB — CBC
HEMATOCRIT: 29.8 % — AB (ref 36.0–46.0)
HEMOGLOBIN: 9.8 g/dL — AB (ref 12.0–15.0)
MCH: 27.5 pg (ref 26.0–34.0)
MCHC: 32.9 g/dL (ref 30.0–36.0)
MCV: 83.7 fL (ref 78.0–100.0)
Platelets: 138 10*3/uL — ABNORMAL LOW (ref 150–400)
RBC: 3.56 MIL/uL — AB (ref 3.87–5.11)
RDW: 14.3 % (ref 11.5–15.5)
WBC: 17.2 10*3/uL — AB (ref 4.0–10.5)

## 2017-08-21 LAB — RPR: RPR Ser Ql: NONREACTIVE

## 2017-08-21 MED ORDER — NALBUPHINE HCL 10 MG/ML IJ SOLN: 5.0000 mg | INTRAMUSCULAR | Status: DC | PRN

## 2017-08-21 MED ORDER — SODIUM CHLORIDE 0.9% FLUSH: 3.0000 mL | INTRAVENOUS | Status: DC | PRN

## 2017-08-21 MED ORDER — NALBUPHINE HCL 10 MG/ML IJ SOLN
5.0000 mg | Freq: Once | INTRAMUSCULAR | Status: DC | PRN
Start: 2017-08-21 — End: 2017-08-23

## 2017-08-21 MED ORDER — DIPHENHYDRAMINE HCL 50 MG/ML IJ SOLN: 12.5000 mg | INTRAMUSCULAR | Status: DC | PRN

## 2017-08-21 MED ORDER — NALBUPHINE HCL 10 MG/ML IJ SOLN: 5.0000 mg | Freq: Once | INTRAMUSCULAR | Status: DC | PRN

## 2017-08-21 MED ORDER — SODIUM CHLORIDE 0.9 % IV SOLN: 2.0000 g | INTRAVENOUS | Status: DC

## 2017-08-21 MED ORDER — DIPHENHYDRAMINE HCL 25 MG PO CAPS: 25.0000 mg | ORAL_CAPSULE | ORAL | Status: DC | PRN

## 2017-08-21 MED ORDER — NALOXONE HCL 0.4 MG/ML IJ SOLN: 0.4000 mg | INTRAMUSCULAR | Status: DC | PRN

## 2017-08-21 MED ORDER — NALOXONE HCL 4 MG/10ML IJ SOLN: 1.0000 ug/kg/h | INTRAVENOUS | Status: DC | PRN

## 2017-08-21 MED ORDER — ONDANSETRON HCL 4 MG/2ML IJ SOLN: 4.0000 mg | Freq: Three times a day (TID) | INTRAMUSCULAR | Status: DC | PRN

## 2017-08-21 NOTE — Op Note (Signed)
Doris Martinez, HOGEN                 ACCOUNT NO.:  [id number]  MEDICAL RECORD NO.:  [id number]  LOCATION:  9170                          FACILITY:  [facility]  PHYSICIAN:  Pieter Partridge, MD   DATE OF BIRTH:  [birth year]  DATE OF PROCEDURE:  08/20/2017 DATE OF DISCHARGE:                              OPERATIVE REPORT   PREOPERATIVE DIAGNOSES:  Intrauterine pregnancy at 40-0/7th weeks, polyhydramnios, failed induction, failure to progress.  POSTOPERATIVE DIAGNOSES:  Intrauterine pregnancy at 40-0/7th weeks, polyhydramnios, failed induction, failure to progress.  PROCEDURE:  Primary low-transverse cesarean section with 2-layer closure.  SURGEON:  Pieter Partridge, MD.  ASSISTANT:  Rhea Pink, CMA.  ANESTHESIA:  Epidural.  ESTIMATED BLOOD LOSS:  900.  DRAINS:  Foley catheter.  SPECIMEN:  None.  COUNTS:  Correct.  PATIENT DISPOSITION:  To PACU, hemodynamically stable.  COMPLICATIONS:  None.  FINDINGS:  Viable female infant in the vertex position, slightly asynclitic, Apgars 9 and 9, copious amount of clear amniotic fluid. Normal uterus, fallopian tubes, and ovaries bilaterally.  Small anterior fibroid approximately 2 to 3 cm.  INDICATIONS:  Doris Martinez is a 32 year old gravida 1, who presented at 37 and 5 due to Doppler elevated blood pressures and ultrasound revealed an AFI of 31 cm.  She was observed for 24 hours and then her BPP was reassuring, but due to probably hydramnios at term, induction was recommended by the Maternal Fetal Medicine on-call.  Then, she got 2 doses of Cytotec, got a Foley bulb, that came out, had 4 cm.  She then had low-dose Pitocin.  An IUPC was placed, that showed contractions were not adequate.  As the Pitocin was increased a bit, the amplitude of the contractions decreased.  The patient was given a rest and watched out and position changes with that were unsuccessful.  Final cervical dilatation was 5 cm, but in a 12-hour period, there was no  change due to inadequate contractions despite our best efforts.  The patient was counseled on need for primary C-section.  Risks, benefits, and alternatives were reviewed.  DESCRIPTION OF PROCEDURE:  The patient was taken to the operating room with IV running and epidural in place.  Epidural anesthesia was maximized.  Time-out was taken.  She was prepped and draped in normal sterile fashion.  Foley catheter was already in place.  The abdomen was marked for Pfannenstiel skin incision.  Scalpel was used to carry the incision down to the fascia, incised at the midline with the Bovie, and extended laterally.  Due to obesity of the patient, very deep reach was needed to visualize the fascia.  Fascia was extended laterally.  Kochers were used to tent up the fascia and the rectus muscles were dissected off the fascia sharply above and below.  Rectus muscles were separated at the midline.  Peritoneum was identified and clamped x2 with hemostat and entered sharply with Metzenbaum scissors. Abdomen was stretched.  Alexis retractor was placed after palpation of the anterior abdominal space.  Lower uterine segment was visualized. The uterus appeared very taut as if she had an amnioinfusion, but she had not.  She just has polyhydramnios.  The serosa was tented  up with the Guernsey[place]ian scissors and incised and extended laterally to develop a bladder flap.  There was a very large vessel on the left.  The uterus was kind of rotated to the patient's right.  So, I wanted to avoid that. A transverse incision was made with a scalpel until clear fluid noted. Bandage scissors extended on the right.  Head was brought to the incision and delivered atraumatically.  Nose and mouth suctioned.  When the head came out, shoulders were delivered easily.  After 1 minute, cord was clamped x2 and cut.  Baby was handed off to the awaiting NICU team.  Placenta was then removed.  Uterus was firm with Pitocin under  pressure. The two-layer closure was performed, second layer was for imbrication. Fused sutures were used for hemostasis.  It did tear through on the lower edge of the uterine segment.  The bleeding was controlled with a series of figure-of-eight sutures.  The abdomen was irrigated.  The lower uterine segment was irrigated. During the closure of the uterus, 1 moistened laparotomy sponge was inserted in the abdomen that was removed prior to closure.  The Interceed was then applied to the hysterotomy incision.  Peritoneum was closed with 2-0 Vicryl in a continuous running fashion. The fascia was closed with 0 Vicryl in a continuous fashion.  The subcu was closed in 2 layers with 2-0 plain gut.  Skin was then reapproximated with 4-0 Monocryl.  Steri-Strips and benzoin pressure dressing were to be applied.  The patient tolerated the procedure well.  All instrument, sponge, and needle counts were correct x3.  SCDs were on her legs and operating.  Ancef 2 g IV was administered prior to the procedure.     Pieter PartridgeEvelyn B Nicky Kras, MD     EBV/MEDQ  D:  08/20/2017  T:  08/21/2017  Job:  [id number]

## 2017-08-21 NOTE — Lactation Note (Signed)
This note was copied from a baby's chart. Lactation Consultation Note  Patient Name: Doris Woody SellerJamie Eugenio AOZHY'QToday's Date: 08/21/2017 Reason for consult: Initial assessment;Term;Primapara;1st time breastfeeding  917 hours old female who is being exclusively BF by her mother, she's a P1. Baby was ready to feed when entering the room, mom put baby STS and baby had difficulty latching on due to flat nipples. Mom has semi-compressible tissue, nipples are flat, baby was able to sustain latch only when LC was "sandwiching" the breast inside baby's mouth. Did some suck training with baby and suck was weak and uncoordinated; baby was very "juicy" probably due to C-section. Even though mom already had bruising and positional stripes in her right breast, she stated that feedings at the breast were comfortable.  Taught mom how to hand express; she got two drops and fed those back to baby with finger feeding and spoon feeding. Got mom set up with a DEBP to stimulate milk supply and also to help ever her nipples.  Mom has history of hypothyroidism but per mom that has been resolved already. Mom also got breast shells for reverse pressure, and she was instructed how to use them. Reminded mom the importance of feeding baby on demand at least 8-12 times/24 hours and STS, mom will be putting baby on the breast according to [facility] recommendations and will also be hand expressing/pumping after feedings and feeding that colostrum to baby immediately on feeding cues. Reviewed feeding diary, BF brochure and BF resources. Mom is aware of LC services and will call PRN.  Maternal Data Formula Feeding for Exclusion: No Has patient been taught Hand Expression?: Yes Does the patient have breastfeeding experience prior to this delivery?: No  Feeding Feeding Type: Breast Fed Length of feed: 2 min  LATCH Score Latch: Repeated attempts needed to sustain latch, nipple held in mouth throughout feeding, stimulation needed to elicit sucking  reflex.  Audible Swallowing: None  Type of Nipple: Flat  Comfort (Breast/Nipple): Filling, red/small blisters or bruises, mild/mod discomfort  Hold (Positioning): Assistance needed to correctly position infant at breast and maintain latch.  LATCH Score: 4  Interventions Interventions: Breast feeding basics reviewed;Assisted with latch;Skin to skin;Breast massage;Hand express;Reverse pressure;Breast compression;Adjust position;Support pillows;Position options;Expressed milk;Shells;DEBP  Lactation Tools Discussed/Used Tools: Shells;Pump Shell Type: Inverted Breast pump type: Double-Electric Breast Pump WIC Program: No Pump Review: Setup, frequency, and cleaning Initiated by:: MPeck Date initiated:: 08/21/17   Consult Status Consult Status: Follow-up Date: 08/21/17 Follow-up type: In-patient    Doris Martinez 08/21/2017, 12:37 PM

## 2017-08-21 NOTE — Progress Notes (Signed)
CSW acknowledges consult.  CSW attempted to meet with MOB, however MOB had several room guest.  CSW will attempt to visit with MOB in the morning (08/22/2017).   Karagan Lehr Boyd-Gilyard, MSW, LCSW Clinical Social Work [phone]  

## 2017-08-21 NOTE — Progress Notes (Signed)
Subjective: Postop Day 1: Cesarean Delivery due to failed IOL/failure to progress No complaints.  Pain controlled.  Lochia normal.  Breast feeding yes.  Objective: Temp:  [97.8 F (36.6 C)-100.1 F (37.8 C)] 98.4 F (36.9 C) (02/12 1015) Pulse Rate:  [65-125] 75 (02/12 1015) Resp:  [14-21] 18 (02/12 1015) BP: (96-146)/(56-93) 100/59 (02/12 1015) SpO2:  [93 %-98 %] 98 % (02/12 1015)  Physical Exam: Gen: NAD Lochia: Not visualized Uterine Fundus: firm, appropriately tender Incision: clean, dry and intact, healing well DVT Evaluation: + Edema present, no calf tenderness bilaterally   Recent Labs    08/20/17 0522 08/20/17 2113  HGB 11.9* 11.7*  HCT 35.3* 34.5*    Assessment/Plan: Status post C-section-doing well postoperatively. H/o anxiety.  Routine post op care. Lactation support. Encouraged ambulation in halls.    Doris Martinez 08/21/2017, 12:56 PM

## 2017-08-22 LAB — BIRTH TISSUE RECOVERY COLLECTION (PLACENTA DONATION)

## 2017-08-22 MED ORDER — SERTRALINE HCL 50 MG PO TABS
50.0000 mg | ORAL_TABLET | Freq: Every day | ORAL | Status: DC
  Administered 2017-08-22 – 2017-08-23 (×2): 50 mg via ORAL
  Filled 2017-08-22 (×3): qty 1

## 2017-08-22 MED ORDER — ONDANSETRON HCL 4 MG PO TABS
8.0000 mg | ORAL_TABLET | Freq: Three times a day (TID) | ORAL | Status: DC | PRN
  Administered 2017-08-22 – 2017-08-23 (×2): 8 mg via ORAL
  Filled 2017-08-22 (×2): qty 2

## 2017-08-22 MED ORDER — BISACODYL 10 MG RE SUPP
10.0000 mg | Freq: Every day | RECTAL | Status: DC | PRN
  Filled 2017-08-22: qty 1

## 2017-08-22 NOTE — Progress Notes (Signed)
Pt nauseated and Dr Richardson Doppole notified and orders received

## 2017-08-22 NOTE — Discharge Instructions (Signed)
Cesarean Delivery, Care After °Refer to this sheet in the next few weeks. These instructions provide you with information about caring for yourself after your procedure. Your health care provider may also give you more specific instructions. Your treatment has been planned according to current medical practices, but problems sometimes occur. Call your health care provider if you have any problems or questions after your procedure. °What can I expect after the procedure? °After the procedure, it is common to have: °· A small amount of blood or clear fluid coming from the incision. °· Some redness, swelling, and pain in your incision area. °· Some abdominal pain and soreness. °· Vaginal bleeding (lochia). °· Pelvic cramps. °· Fatigue. ° °Follow these instructions at home: °Incision care ° °· Follow instructions from your health care provider about how to take care of your incision. Make sure you: °? Wash your hands with soap and water before you change your bandage (dressing). If soap and water are not available, use hand sanitizer. °? If you have a dressing, change it as told by your health care provider. °? Leave stitches (sutures), skin staples, skin glue, or adhesive strips in place. These skin closures may need to stay in place for 2 weeks or longer. If adhesive strip edges start to loosen and curl up, you may trim the loose edges. Do not remove adhesive strips completely unless your health care provider tells you to do that. °· Check your incision area every day for signs of infection. Check for: °? More redness, swelling, or pain. °? More fluid or blood. °? Warmth. °? Pus or a bad smell. °· When you cough or sneeze, hug a pillow. This helps with pain and decreases the chance of your incision opening up (dehiscing). Do this until your incision heals. °Medicines °· Take over-the-counter and prescription medicines only as told by your health care provider. °· If you were prescribed an antibiotic medicine, take it  as told by your health care provider. Do not stop taking the antibiotic until it is finished. °Driving °· Do not drive or operate heavy machinery while taking prescription pain medicine. °Lifestyle °· Do not drink alcohol. This is especially important if you are breastfeeding or taking pain medicine. °· Do not use tobacco products, including cigarettes, chewing tobacco, or e-cigarettes. If you need help quitting, ask your health care provider. Tobacco can delay wound healing. °Eating and drinking °· Drink at least 8 eight-ounce glasses of water every day unless told not to by your health care provider. If you breastfeed, you may need to drink more water than this. °· Eat high-fiber foods every day. These foods may help prevent or relieve constipation. High-fiber foods include: °? Whole grain cereals and breads. °? Brown rice. °? Beans. °? Fresh fruits and vegetables. °Activity °· Return to your normal activities as told by your health care provider. Ask your health care provider what activities are safe for you. °· Rest as much as possible. Try to rest or take a nap while your baby is sleeping. °· Do not lift anything that is heavier than your baby or 10 lb (4.5 kg) as told by your health care provider. °· Ask your health care provider when you can engage in sexual activity. This may depend on your: °? Risk of infection. °? Healing rate. °? Comfort and desire to engage in sexual activity. °Bathing °· Do not take baths, swim, or use a hot tub until your health care provider approves. Ask your health care provider if   you can take showers. You may only be allowed to take sponge baths until your incision heals. °General instructions °· Do not use tampons or douches until your health care provider approves. °· Wear: °? Loose, comfortable clothing. °? A supportive and well-fitting bra. °· Watch for any blood clots that may pass from your vagina. These may look like clumps of dark red, brown, or black discharge. °· Keep  your perineum clean and dry as told by your health care provider. °· Wipe from front to back when you use the toilet. °· If possible, have someone help you care for your baby and help with household activities for a few days after you leave the hospital. °· Keep all follow-up visits for you and your baby as told by your health care provider. This is important. °Contact a health care provider if: °· You have: °? Bad-smelling vaginal discharge. °? Difficulty urinating. °? Pain when urinating. °? A sudden increase or decrease in the frequency of your bowel movements. °? More redness, swelling, or pain around your incision. °? More fluid or blood coming from your incision. °? Pus or a bad smell coming from your incision. °? A fever. °? A rash. °? Little or no interest in activities you used to enjoy. °? Questions about caring for yourself or your baby. °? Nausea. °· Your incision feels warm to the touch. °· Your breasts turn red or become painful or hard. °· You feel unusually sad or worried. °· You vomit. °· You pass large blood clots from your vagina. If you pass a blood clot, save it to show to your health care provider. Do not flush blood clots down the toilet without showing your health care provider. °· You urinate more than usual. °· You are dizzy or light-headed. °· You have not breastfed and have not had a menstrual period for 12 weeks after delivery. °· You stopped breastfeeding and have not had a menstrual period for 12 weeks after stopping breastfeeding. °Get help right away if: °· You have: °? Pain that does not go away or get better with medicine. °? Chest pain. °? Difficulty breathing. °? Blurred vision or spots in your vision. °? Thoughts about hurting yourself or your baby. °? New pain in your abdomen or in one of your legs. °? A severe headache. °· You faint. °· You bleed from your vagina so much that you fill two sanitary pads in one hour. °This information is not intended to replace advice given to  you by your health care provider. Make sure you discuss any questions you have with your health care provider. °Document Released: 03/18/2002 Document Revised: 07/29/2016 Document Reviewed: 05/31/2015 °Elsevier Interactive Patient Education © 2018 [employer] ° ° °Postpartum Depression and Baby Blues °The postpartum period begins right after the birth of a baby. During this time, there is often a great amount of joy and excitement. It is also a time of many changes in the life of the parents. Regardless of how many times a mother gives birth, each child brings new challenges and dynamics to the family. It is not unusual to have feelings of excitement along with confusing shifts in moods, emotions, and thoughts. All mothers are at risk of developing postpartum depression or the "baby blues." These mood changes can occur right after giving birth, or they may occur many months after giving birth. The baby blues or postpartum depression can be mild or severe. Additionally, postpartum depression can go away rather quickly, or it can   be a long-term condition. °What are the causes? °Raised hormone levels and the rapid drop in those levels are thought to be a main cause of postpartum depression and the baby blues. A number of hormones change during and after pregnancy. Estrogen and progesterone usually decrease right after the delivery of your baby. The levels of thyroid hormone and various cortisol steroids also rapidly drop. Other factors that play a role in these mood changes include major life events and genetics. °What increases the risk? °If you have any of the following risks for the baby blues or postpartum depression, know what symptoms to watch out for during the postpartum period. Risk factors that may increase the likelihood of getting the baby blues or postpartum depression include: °· Having a personal or family history of depression. °· Having depression while being pregnant. °· Having premenstrual mood  issues or mood issues related to oral contraceptives. °· Having a lot of life stress. °· Having marital conflict. °· Lacking a social support network. °· Having a baby with special needs. °· Having health problems, such as diabetes. ° °What are the signs or symptoms? °Symptoms of baby blues include: °· Brief changes in mood, such as going from extreme happiness to sadness. °· Decreased concentration. °· Difficulty sleeping. °· Crying spells, tearfulness. °· Irritability. °· Anxiety. ° °Symptoms of postpartum depression typically begin within the first month after giving birth. These symptoms include: °· Difficulty sleeping or excessive sleepiness. °· Marked weight loss. °· Agitation. °· Feelings of worthlessness. °· Lack of interest in activity or food. ° °Postpartum psychosis is a very serious condition and can be dangerous. Fortunately, it is rare. Displaying any of the following symptoms is cause for immediate medical attention. Symptoms of postpartum psychosis include: °· Hallucinations and delusions. °· Bizarre or disorganized behavior. °· Confusion or disorientation. ° °How is this diagnosed? °A diagnosis is made by an evaluation of your symptoms. There are no medical or lab tests that lead to a diagnosis, but there are various questionnaires that a health care provider may use to identify those with the baby blues, postpartum depression, or psychosis. Often, a screening tool called the Edinburgh Postnatal Depression Scale is used to diagnose depression in the postpartum period. °How is this treated? °The baby blues usually goes away on its own in 1-2 weeks. Social support is often all that is needed. You will be encouraged to get adequate sleep and rest. Occasionally, you may be given medicines to help you sleep. °Postpartum depression requires treatment because it can last several months or longer if it is not treated. Treatment may include individual or group therapy, medicine, or both to address any  social, physiological, and psychological factors that may play a role in the depression. Regular exercise, a healthy diet, rest, and social support may also be strongly recommended. °Postpartum psychosis is more serious and needs treatment right away. Hospitalization is often needed. °Follow these instructions at home: °· Get as much rest as you can. Nap when the baby sleeps. °· Exercise regularly. Some women find yoga and walking to be beneficial. °· Eat a balanced and nourishing diet. °· Do little things that you enjoy. Have a cup of tea, take a bubble bath, read your favorite magazine, or listen to your favorite music. °· Avoid alcohol. °· Ask for help with household chores, cooking, grocery shopping, or running errands as needed. Do not try to do everything. °· Talk to people close to you about how you are feeling. Get support from your   partner, family members, friends, or other new moms. °· Try to stay positive in how you think. Think about the things you are grateful for. °· Do not spend a lot of time alone. °· Only take over-the-counter or prescription medicine as directed by your health care provider. °· Keep all your postpartum appointments. °· Let your health care provider know if you have any concerns. °Contact a health care provider if: °You are having a reaction to or problems with your medicine. °Get help right away if: °· You have suicidal feelings. °· You think you may harm the baby or someone else. °This information is not intended to replace advice given to you by your health care provider. Make sure you discuss any questions you have with your health care provider. °Document Released: 03/30/2004 Document Revised: 12/02/2015 Document Reviewed: 04/07/2013 °Elsevier Interactive Patient Education © 2017 [employer] Inc. ° °

## 2017-08-22 NOTE — Progress Notes (Signed)
CSW received consult for hx of Anxiety and Depression.  CSW met with MOB to offer support and complete assessment.  When CSW arrived, MOB was resting in bed, FOB on watching TV, and MGM was holding infant.  CSW explained CSW's roles and MOB gave CSW permission to complete the assessment while MOB's guest were present. MOB was easy to engage, forthcoming, and receptive to meeting with CSW.   CSW asked about MOB's MH hx and MOB openly shared a dx of anxiety.  MOB communicated being dx about 8 years ago.  MOB also reported that MOB medication (name unknown) is taken PRN to assist MOB with controlling symptoms.CSW explored other natural ways to reduce and manage symptoms; MOB was receptive.   CSW provided education regarding the baby blues period vs. perinatal mood disorders, discussed treatment and gave resources for mental health follow up if concerns arise.  CSW recommends self-evaluation during the postpartum time period using the New Mom Checklist from Postpartum Progress and encouraged MOB to contact a medical professional if symptoms are noted at any time.  CSW assessed for safety and MOB denied SI and HI.  MOB did not present with any acute MH signs and appeared to have insight and awareness.  MOB also shared that MOB has a great support team that will be available as needed.   CSW identifies no further need for intervention and no barriers to discharge at this time.  Chou Busler Boyd-Gilyard, MSW, LCSW Clinical Social Work [phone]   

## 2017-08-22 NOTE — Progress Notes (Signed)
Subjective: Postop Day 2: Cesarean Delivery due to failed IOL/failure to progress No complaints.  Pain controlled.  Lochia normal.  Breast feeding yes.  Pt has had some flatus, no BM.  She has not ambulated in the halls. Pt has h/o anxiety.  Not really having any issues except when the baby cries and she does not know why.  Pt has never been on Zoloft.    Objective: Temp:  [98 F (36.7 C)-98.4 F (36.9 C)] 98 F (36.7 C) (02/13 0541) Pulse Rate:  [75-82] 82 (02/13 0541) Resp:  [18] 18 (02/13 0541) BP: (100-116)/(59-74) 116/74 (02/13 0541) SpO2:  [98 %-100 %] 100 % (02/12 1800)  Physical Exam: Gen: NAD Lochia: Not visualized Uterine Fundus: firm, appropriately tender Incision: Pressure dressing still in place. DVT Evaluation: + Edema present, no calf tenderness bilaterally   Recent Labs    08/20/17 2113 08/21/17 0502  HGB 11.7* 9.8*  HCT 34.5* 29.8*    Assessment/Plan: Status post C-section-doing well postoperatively.  Dressing needs to be removed. H/o anxiety.  Start Zoloft today.  F/u 2 weeks postpartum. Routine post op care. Lactation support. Encouraged ambulation in halls again.  Stressed that it will improve bowel function. Discharge in am.    Doris Martinez 08/22/2017, 8:48 AM

## 2017-08-23 MED ORDER — TRAMADOL HCL 50 MG PO TABS
50.0000 mg | ORAL_TABLET | Freq: Four times a day (QID) | ORAL | Status: DC | PRN
  Administered 2017-08-23: 50 mg via ORAL
  Filled 2017-08-23: qty 1

## 2017-08-23 MED ORDER — HYDROXYZINE HCL 25 MG PO TABS: 25.0000 mg | ORAL_TABLET | Freq: Four times a day (QID) | ORAL | 2 refills | Status: AC | PRN

## 2017-08-23 MED ORDER — TRAMADOL HCL 50 MG PO TABS: 50.0000 mg | ORAL_TABLET | Freq: Four times a day (QID) | ORAL | 0 refills | Status: AC | PRN

## 2017-08-23 MED ORDER — HYDROXYZINE HCL 25 MG PO TABS
25.0000 mg | ORAL_TABLET | Freq: Four times a day (QID) | ORAL | Status: DC | PRN
  Administered 2017-08-23: 25 mg via ORAL
  Filled 2017-08-23 (×2): qty 1

## 2017-08-23 MED ORDER — SERTRALINE HCL 50 MG PO TABS: 50.0000 mg | ORAL_TABLET | Freq: Every day | ORAL | 6 refills | Status: AC

## 2017-08-23 MED ORDER — IBUPROFEN 600 MG PO TABS: 600.0000 mg | ORAL_TABLET | Freq: Four times a day (QID) | ORAL | 0 refills | Status: AC

## 2017-08-23 NOTE — Discharge Summary (Signed)
OB Discharge Summary     Patient Name: Doris GreenJamie D Echavarria DOB: 11/25/1985 MRN: [id number]  Date of admission: 08/18/2017 Delivering MD: Geryl RankinsVARNADO, Shawnette Augello   Date of discharge: 08/23/2017  Admitting diagnosis: 39 wks increased bp told to come in Intrauterine pregnancy: 3191w0d     Secondary diagnosis:  Principal Problem:   Polyhydramnios affecting pregnancy in third trimester Active Problems:   Heart palpitations   Anxiety   History of pelvic fracture   Elevated BP without diagnosis of hypertension   Maternal obesity affecting pregnancy, antepartum--BMI 44   Polyhydramnios  Additional problems: None     Discharge diagnosis: Term Pregnancy Delivered                                                                                                Post partum procedures:None  Augmentation: AROM, Pitocin and Cytotec  Complications: None  Hospital course:  Induction of Labor With Cesarean Section  32 y.o. yo G1P1001 at 4091w0d was admitted to the hospital 08/18/2017 for induction of labor. Patient had a labor course significant for Arrest. The patient went for cesarean section due to Arrest of Dilation, and delivered a Viable infant,08/20/2017  Membrane Rupture Time/Date: 5:07 AM ,08/20/2017   Details of operation can be found in separate operative Note.  Patient had an uncomplicated postpartum course. She is ambulating, tolerating a regular diet, passing flatus, and urinating well.  Patient is discharged home in stable condition on 09/13/17.                                    Physical exam  Vitals:   08/21/17 1800 08/22/17 0541 08/22/17 1701 08/23/17 0441  BP: 112/64 116/74 128/82 128/74  Pulse: 80 82 88 75  Resp: 18 18 18 20   Temp: 98.3 F (36.8 C) 98 F (36.7 C) 97.9 F (36.6 C) 98.4 F (36.9 C)  TempSrc: Axillary Oral Oral Oral  SpO2: 100%     Weight:      Height:       General: alert, cooperative and no distress Lochia: appropriate Uterine Fundus: firm Incision: Dressing is clean,  dry, and intact DVT Evaluation: No evidence of DVT seen on physical exam. Calf/Ankle edema is present Labs: Lab Results  Component Value Date   WBC 17.2 (H) 08/21/2017   HGB 9.8 (L) 08/21/2017   HCT 29.8 (L) 08/21/2017   MCV 83.7 08/21/2017   PLT 138 (L) 08/21/2017   CMP Latest Ref Rng & Units 08/19/2017  Glucose 65 - 99 mg/dL 79  BUN 6 - 20 mg/dL 7  Creatinine 4.090.44 - 8.111.00 mg/dL 9.140.65  Sodium 782135 - 956145 mmol/L 135  Potassium 3.5 - 5.1 mmol/L 3.9  Chloride 101 - 111 mmol/L 105  CO2 22 - 32 mmol/L 21(L)  Calcium 8.9 - 10.3 mg/dL 2.1(H8.4(L)  Total Protein 6.5 - 8.1 g/dL 0.8(M5.7(L)  Total Bilirubin 0.3 - 1.2 mg/dL 0.3  Alkaline Phos 38 - 126 U/L 143(H)  AST 15 - 41 U/L 17  ALT 14 - 54 U/L 14  Discharge instruction: per After Visit Summary and "Baby and Me Booklet".  After visit meds:  Percocet, Ibuprofen, Zoloft  Diet: routine diet  Activity: Advance as tolerated. Pelvic rest for 6 weeks.   Outpatient follow up:2 weeks Follow up Appt:No future appointments. Follow up Visit:No Follow-up on file.  Postpartum contraception: Discussed  Newborn Data: Live born female  Birth Weight: 7 lb 9.7 oz (3450 g) APGAR: 9, 9  Newborn Delivery   Birth date/time:  08/20/2017 19:14:32 Delivery type:  C-Section, Low Vertical C-section categorization:  Primary     Baby Feeding: Breast Disposition:home with mother   08/23/2017 Geryl Rankins, MD

## 2017-08-23 NOTE — Lactation Note (Signed)
This note was copied from a baby's chart. Lactation Consultation Note  Patient Name: Doris Martinez YNWGN'FToday's Date: 08/23/2017  Discussed with parents milk coming to volume in 3-5 days.  Baby is now 5963 hours old and bottle feeding formula.  Mom is pumping every 4 hours but not obtaining milk yet.  Mom is discouraged.  She had good breast changes in pregnancy which I told her is reassuring.  Instructed to pump every 2-3 hours x 15 minutes with DEBP at home.  I offered latch assist prior to discharge but mom states it is too stressful for she and the baby.  Recommended an outpatient appointment next week after milk is in.  Provided with phone number to call with concerns or appointment.   Maternal Data    Feeding Feeding Type: Bottle Fed - Formula Nipple Type: Slow - flow  LATCH Score                   Interventions    Lactation Tools Discussed/Used     Consult Status      Huston FoleyMOULDEN, Krislynn Gronau S 08/23/2017, 10:32 AM

## 2017-09-17 ENCOUNTER — Ambulatory Visit: Payer: Self-pay

## 2017-09-17 NOTE — Lactation Note (Signed)
This note was copied from a baby's chart. 09/17/2017  Name: Doris Martinez MRN: [id number] Date of Birth: 08/20/2017 Gestational Age: Gestational Age: 3015w0d Birth Weight: 121.7 oz Weight today:    9 pounds 2.8 ounces (4162 grams) with clean newborn diaper  Doris Martinez has gained 526 grams in the last 14 days with an average daily weight gain of 38 grams a day.   Doris Martinez presents today with mom and dad for follow up feeding assessment. Infant with lip and tongue revision by Dr. Lexine BatonHisaw on September 13, 2017.   Mom reports infant is not really BF, mom tries once a day and otherwise gives bottles of EBM with a Dr. Theora GianottiBrown's Level 1 nipple.   Infant with granulation tissue to upper lip and under tongue without redness or drainage. Parents are performing lip and tongue stretches as prescribed. Mom is trying to perform suck training as infant will participate. Infant with better tongue extension, elevation and lateralization today. She still has a weak seal on the bottle and the breast with a lot of clicking. Handout given on suck training and enc mom or dad to perform 6-8 x a day.   Mom with pink nipples. Mom is using soap to nipples in the shower, enc her to just wash with water. Infant does not have signs of thrush at this time. She reports she used the All Purpose nipple ointment for 5 days and then stopped using. Enc mom to resume using All Purpose Nipple Ointment again, wash disposable bra pads and bras in hot water and hot dryer daily, boil pump parts and pacifier daily as mom has been doing.   Mom to try # 21 flanges for the next 24 hours and then switch back up to size 24. Mom reported increased comfort with the 21 flanges. She is noted to have a red ring around her right nipple that is the most painful side. Mom was turning the suction up very high but is now on the lower setting.   Mom is pumping every 3-4 hours and is getting 60 ml-6 oz a pumping and is able to feed infant and has several bags  stored. Discussed with mom that if mom's symptoms to the breast is yeast that it is not recommended that she freeze her milk as yeast is not killed by freezing. Mom reports she is not planning to discard her milk. Mom given instructions on treating yeast on the nipples. Mom reports no pain to the breast.   Mom latched infant to the right breast with the # 24 NS and infant latched and fed for about 10 minutes and transferred 42 ml. Infant burped, weighed and latched to the left breast in the football hold. Attempted to latch without the NS and infant would not stay latched and nipple was compressed # 24 NS was applied and infant latched and fed briefly and came off transferring 6 ml. Mom did not want to relatch her.  Mom attempted to offer a bottle post BF and infant refused.    Parents were in a hurry to leave to pick up their car. Plan reviewed with parents.   Infant to follow up with Dr. Lexine BatonHisaw on 3/14. Infant with follow up Ped appt on 4/8. Mom aware of BF Support Groups. Mom wants to follow up with Lactation prn. Mom has phone # to call with questions/concerns.     General Information: Mother's reason for visit: Post Tongue/Lip tie Revisions Consult: Follow-up Lactation consultant: Noralee StainSharon Staphanie Harbison RN,IBCLC Breastfeeding experience:  not latching well. pumping and bottle feeding Maternal medical conditions: Thyroid Maternal medications: Pre-natal vitamin, Other(Fish Oil)  Breastfeeding History: Frequency of breast feeding: once a day Duration of feeding: wont latch  Supplementation: Supplement method: bottle(Dr. Brown's Level 1)         Breast milk volume: 100 ml Breast milk frequency: every 3 hours Total breast milk volume per day: 800 ml Pump type: Medela pump in style Pump frequency: every 3 hours during the day, every 4 hours at night Pump volume: 2-6 ounces  Infant Output Assessment: Voids per 24 hours: 8+ Urine color: Clear yellow Stools per 24 hours: 8+ Stool color:  Yellow  Breast Assessment: Breast: Filling, Compressible Nipple: Erect, Reddened Pain level: 3 Pain interventions: Bra  Feeding Assessment: Infant oral assessment: Variance Infant oral assessment comment: Infant with better tongue extension, elevation and lateralization today. She still has a weak seal on the bottle and the breast with a lot of clicking. Upper lip is tight with flanging and at the breast Positioning: Football(right breast) Latch: 2 - Grasps breast easily, tongue down, lips flanged, rhythmical sucking. Audible swallowing: 2 - Spontaneous and intermittent Type of nipple: 2 - Everted at rest and after stimulation Comfort: 2 - Soft/non-tender Hold: 2 - No assistance needed to correctly position infant at breast LATCH score: 10 Latch assessment: Deep Lips flanged: No Suck assessment: Displays both Tools: Nipple shield 24 mm Pre-feed weight: 4162 grams Post feed weight: 4204 grams Amount transferred: 42 ml Amount supplemented: 0  Additional Feeding Assessment: Infant oral assessment: Variance Infant oral assessment comment: see above Positioning: Football(left breast) Latch: 1 - Repeated attempts neede to sustain latch, nipple held in mouth throughout feeding, stimulation needed to elicit sucking reflex. Audible swallowing: 1 - A few with stimulation Type of nipple: 2 - Everted at rest and after stimulation Comfort: 2 - Soft/non-tender Hold: 2 - No assistance needed to correctly position infant at breast LATCH score: 8 Latch assessment: Shallow Lips flanged: No Suck assessment: Displays both Tools: Nipple shield 24 mm Pre-feed weight: 4204 grams Post feed weight: 4210 grams Amount transferred: 6 ml Amount supplemented: 0  Totals: Total amount transferred: 48 ml Total supplement given: 0-infant refused Total amount pumped post feed: 0  1. Continue to offer the breast as mom and infant desire, try to breast feed at least 4 x a day 2. Use the # 24 nipple  shield with feeding as needed  3. Keep infant pulled in close to the breast with feeding 4. Massage/compress breast with feeding 5. Continue pumping at least 8 x a day for 15-20 minutes 6. Resume All Purpose Nipple Ointment 7. Use # 21 flanges for the next 24 hours and then return to the # 24 flanges 8. Keep pump suction to tolerable level 9. Boil and bottle, pump parts, and pacifiers daily 10. Wash bras/disposable bra pads daily and dry in hot dryer 11. Air dry nipples as able. No soap to nipples 12. Continue lip/tongue stretches 13. Continue Suck training exercises 6-8 x a day 14. Call for assistance as needed [phone] 15. Keep up the good work 16. Thank you for allowing me to assist you today.    Ed Blalock RN, IBCLC  Doris Martinez Doris Martinez 09/17/2017, 3:52 PM

## 2018-08-01 ENCOUNTER — Other Ambulatory Visit: Payer: Self-pay | Admitting: Family Medicine

## 2018-08-01 DIAGNOSIS — N644 Mastodynia: Secondary | ICD-10-CM

## 2018-08-26 ENCOUNTER — Other Ambulatory Visit: Payer: BC Managed Care – PPO

## 2018-09-03 ENCOUNTER — Ambulatory Visit
Admission: RE | Admit: 2018-09-03 | Discharge: 2018-09-03 | Disposition: A | Discharge status: Home / Self Care | Payer: BC Managed Care – PPO | Source: Ambulatory Visit | Attending: Family Medicine | Admitting: Family Medicine

## 2018-09-03 ENCOUNTER — Ambulatory Visit: Payer: BC Managed Care – PPO

## 2018-09-03 DIAGNOSIS — N644 Mastodynia: Secondary | ICD-10-CM

## 2019-02-04 ENCOUNTER — Other Ambulatory Visit: Payer: Self-pay | Admitting: Family Medicine

## 2019-02-04 DIAGNOSIS — R1011 Right upper quadrant pain: Secondary | ICD-10-CM

## 2019-02-12 ENCOUNTER — Ambulatory Visit
Admission: RE | Admit: 2019-02-12 | Discharge: 2019-02-12 | Disposition: A | Discharge status: Home / Self Care | Payer: BC Managed Care – PPO | Source: Ambulatory Visit | Attending: Family Medicine | Admitting: Family Medicine

## 2019-02-12 DIAGNOSIS — R1011 Right upper quadrant pain: Secondary | ICD-10-CM

## 2019-07-14 ENCOUNTER — Ambulatory Visit: Payer: BC Managed Care – PPO | Attending: Internal Medicine

## 2019-07-14 DIAGNOSIS — U071 COVID-19: Secondary | ICD-10-CM

## 2019-07-15 LAB — NOVEL CORONAVIRUS, NAA: SARS-CoV-2, NAA: NOT DETECTED

## 2020-01-16 IMAGING — MG DIGITAL DIAGNOSTIC BILATERAL MAMMOGRAM WITH TOMO AND CAD
6 of 10 series · 6 of 30 positions shown · non-contrast
Comparison: Previous exam(s).

Addendum:
CLINICAL DATA: Diffuse right breast pain for 3 weeks.

EXAM:
DIGITAL DIAGNOSTIC BILATERAL MAMMOGRAM WITH CAD AND TOMO

[L MLO synth-2D]
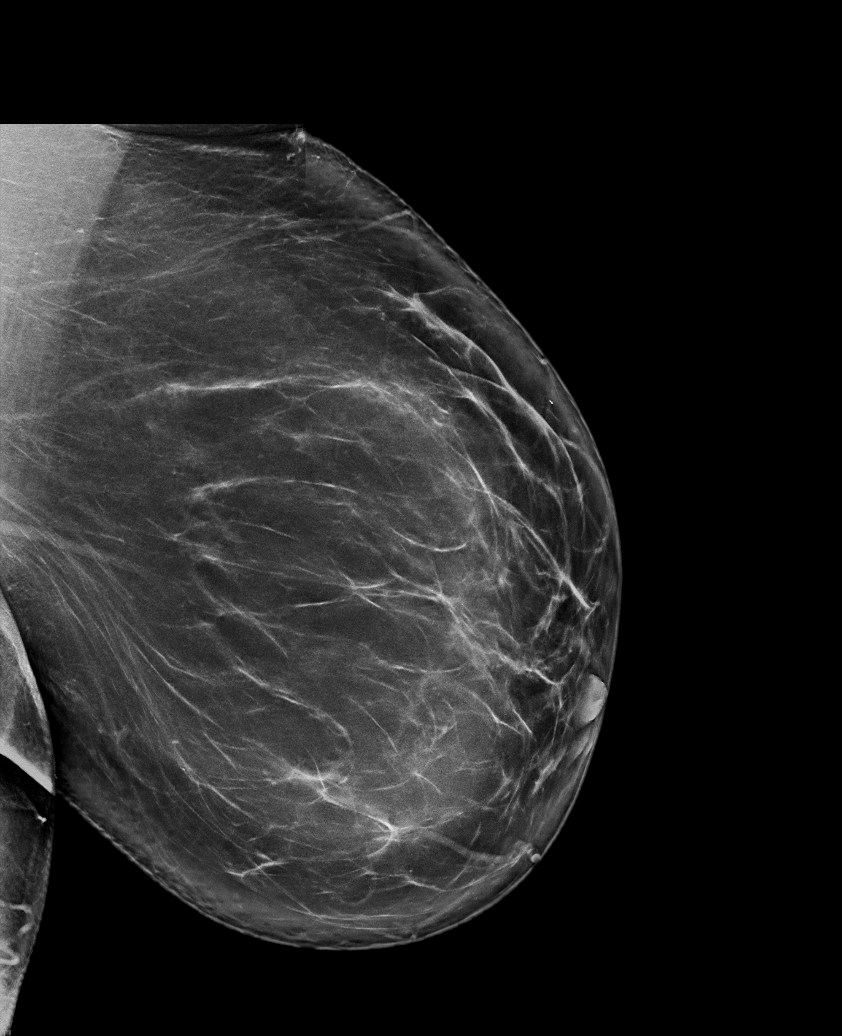

[L CC synth-2D (1 of 2)]
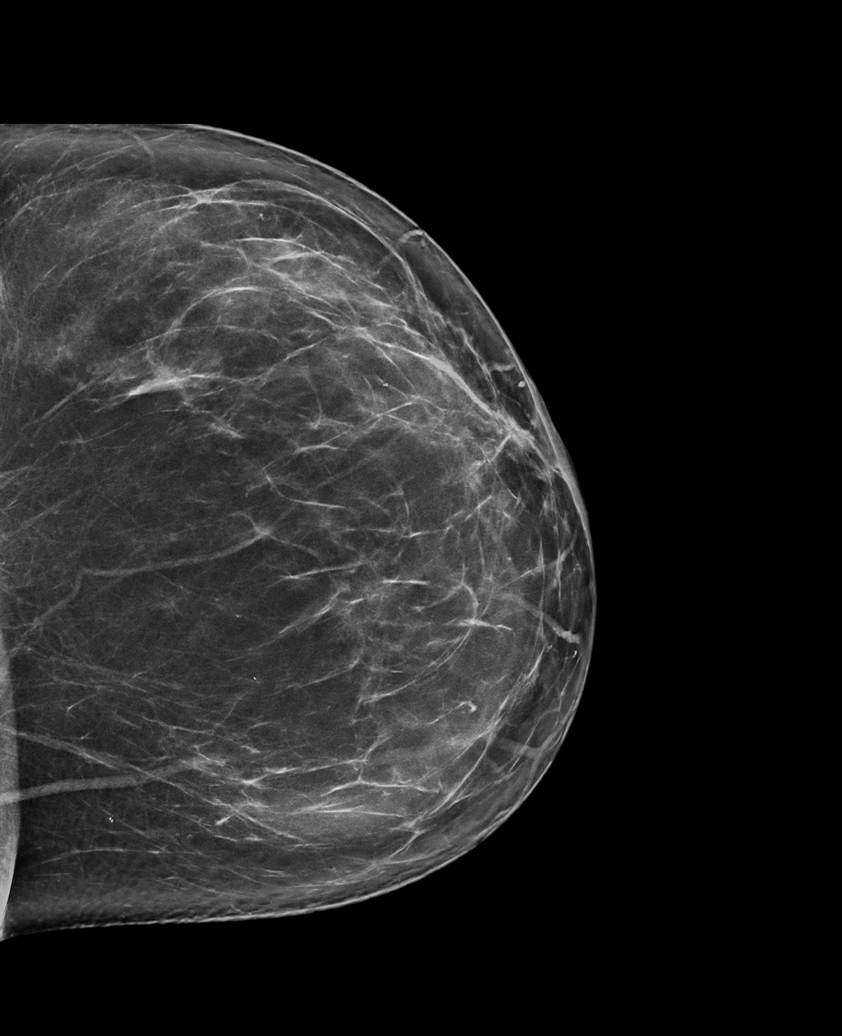

[L CC synth-2D (2 of 2)]
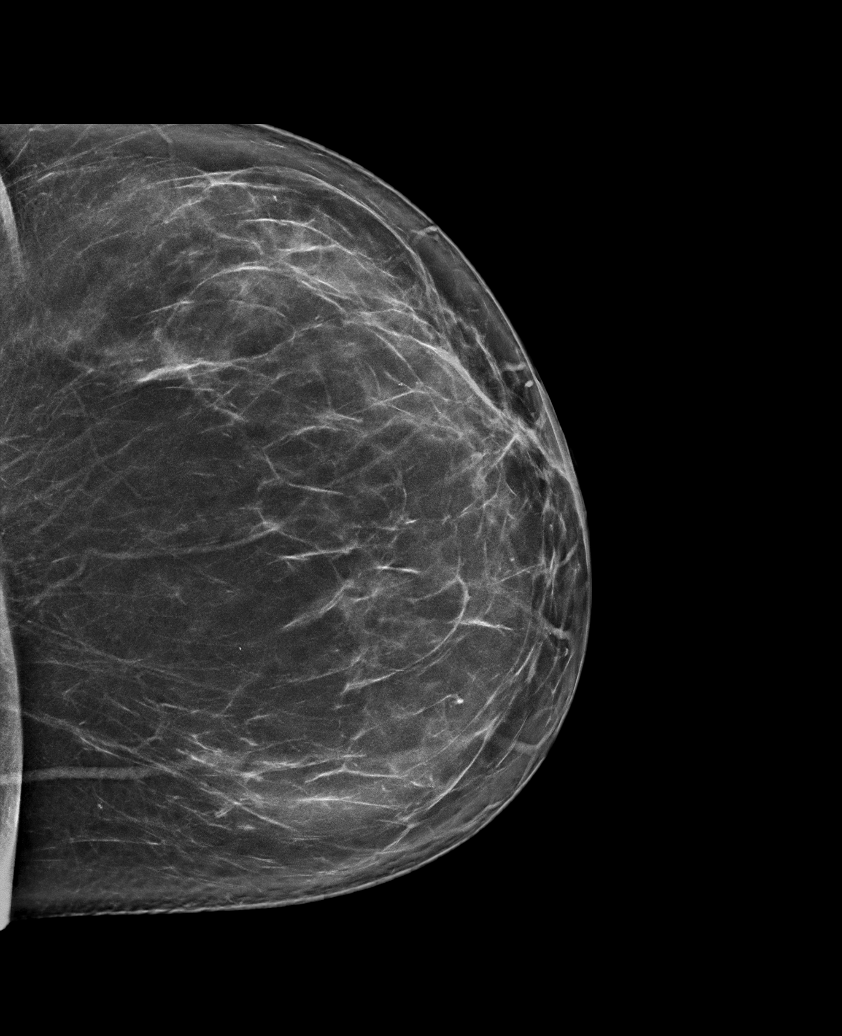

[R MLO synth-2D]
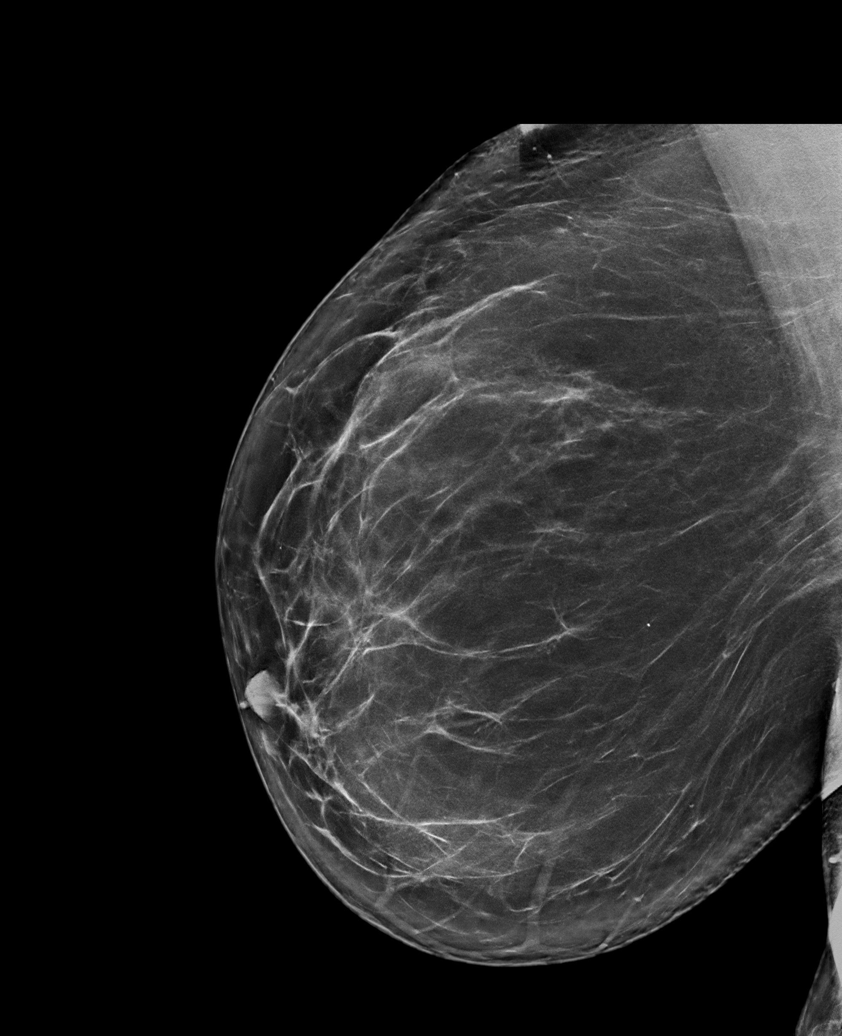

[R CC synth-2D]
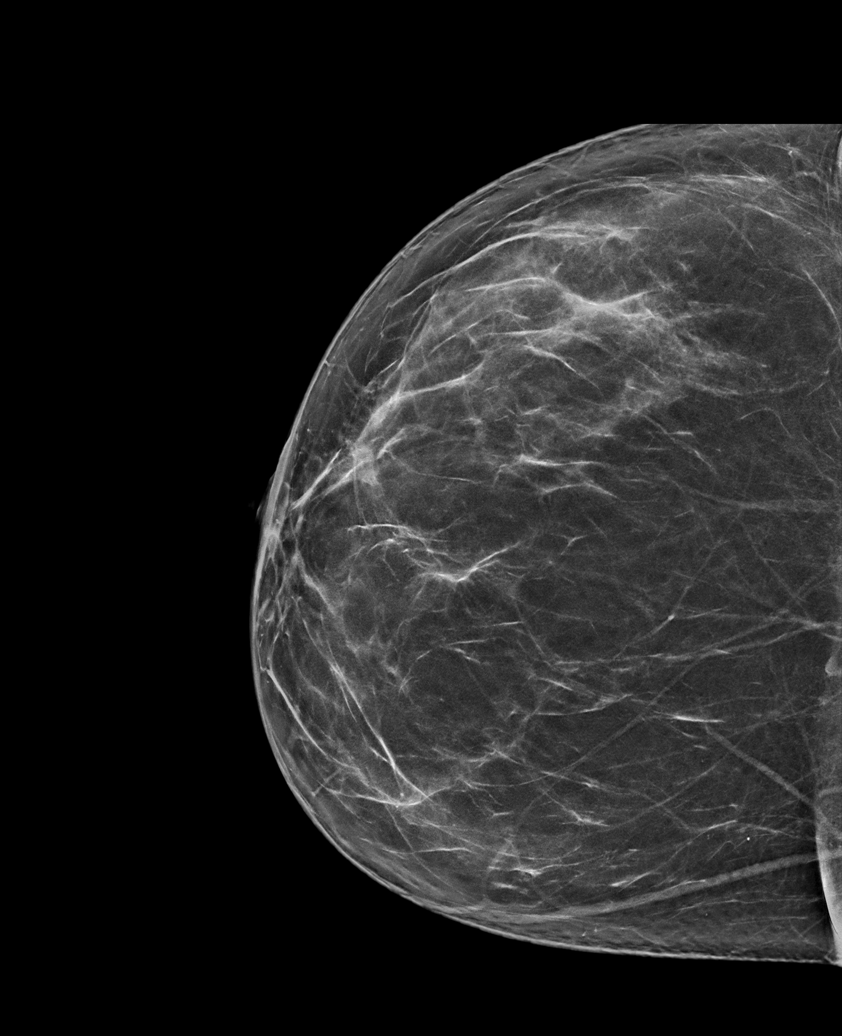

[L CC tomo · tomo slice 45/88.0]
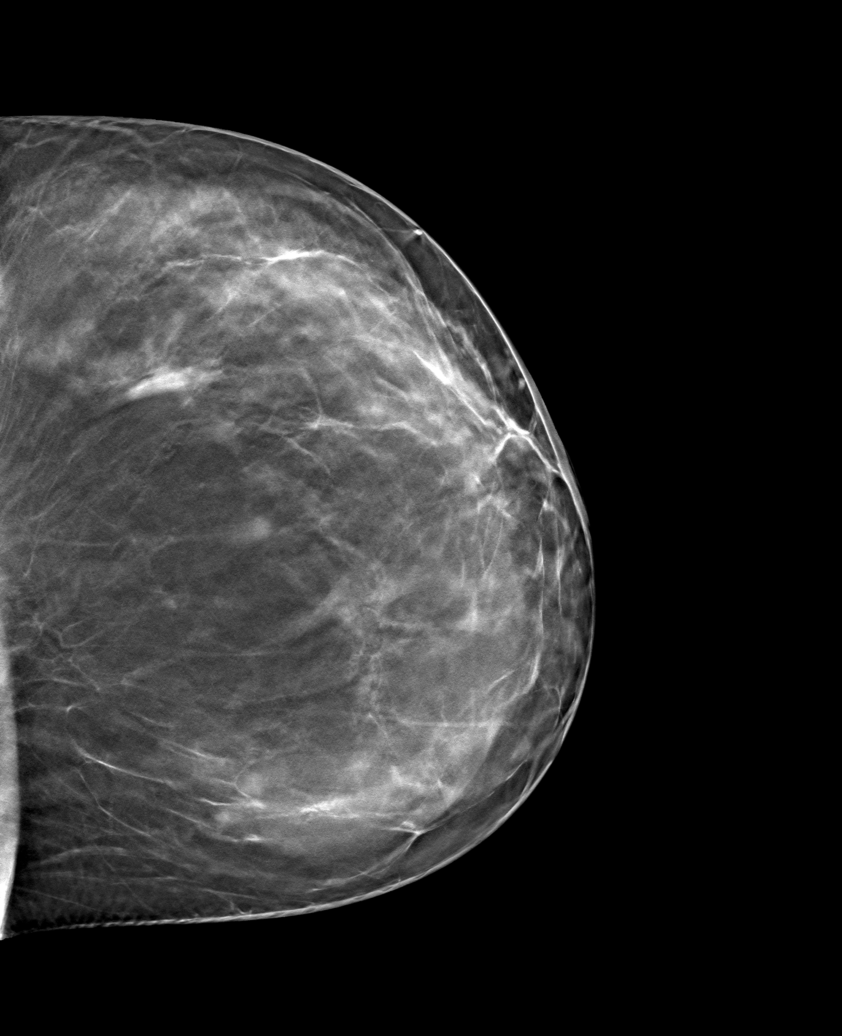

[6 of 30 positions shown; findings below may reference images not displayed]

ACR Breast Density Category b: There are scattered areas of
fibroglandular density.
FINDINGS: No suspicious masses, calcifications, or distortion in either
breast.

Mammographic images were processed with CAD.
IMPRESSION: No mammographic evidence of malignancy.

RECOMMENDATION:
Treatment of the patient's symptoms should be based on clinical and
physical exam given lack of imaging findings. Recommend annual
screening mammography beginning at the age of 40.

I have discussed the findings and recommendations with the patient.
Results were also provided in writing at the conclusion of the
visit. If applicable, a reminder letter will be sent to the patient
regarding the next appointment.

BI-RADS CATEGORY  2: Benign.

ADDENDUM:
There were no previous studies for comparison.

*** End of Addendum ***

## 2020-06-26 IMAGING — US ULTRASOUND ABDOMEN LIMITED
1 series · 14 of 25 positions shown · non-contrast
Comparison: 05/25/2017

CLINICAL DATA: Right upper quadrant abdominal pain

EXAM:
ULTRASOUND ABDOMEN LIMITED RIGHT UPPER QUADRANT

[Series 1: ultrasound abdomen limited · 0.25mm/px · 14 of 49 slices shown]
[im 1/49]
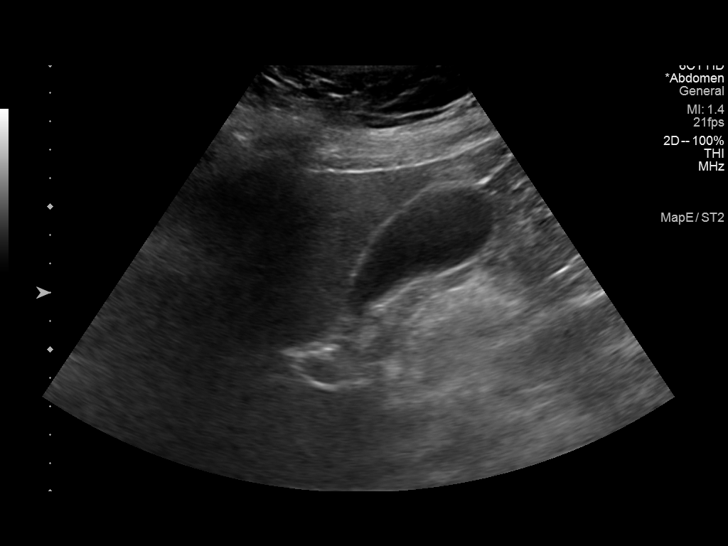
[im 5/49]
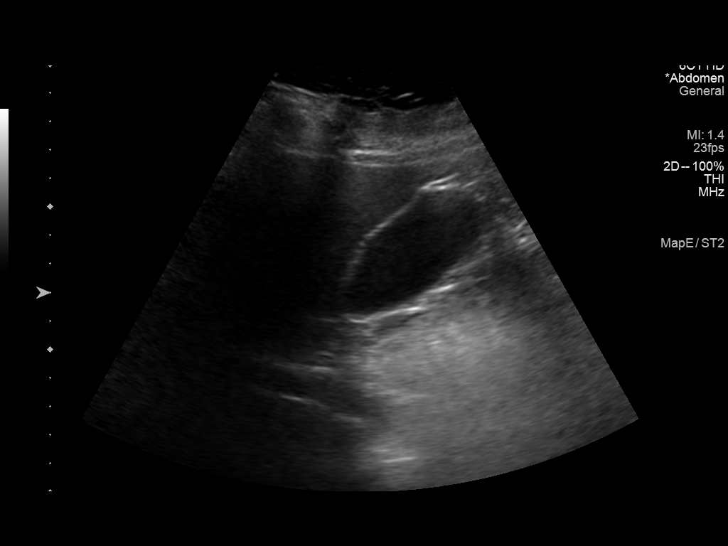
[im 9/49]
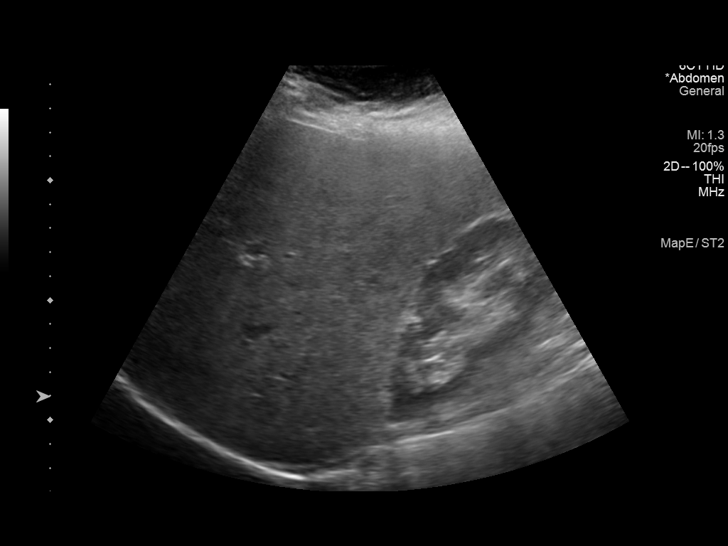
[im 13/49]
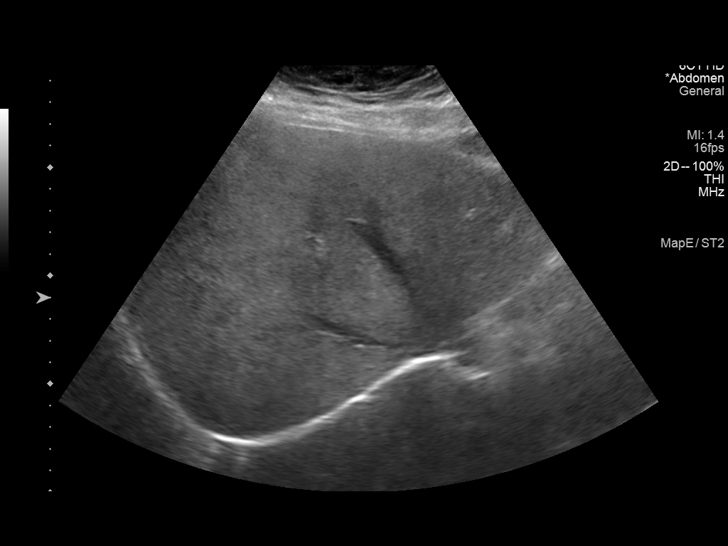
[im 17/49]
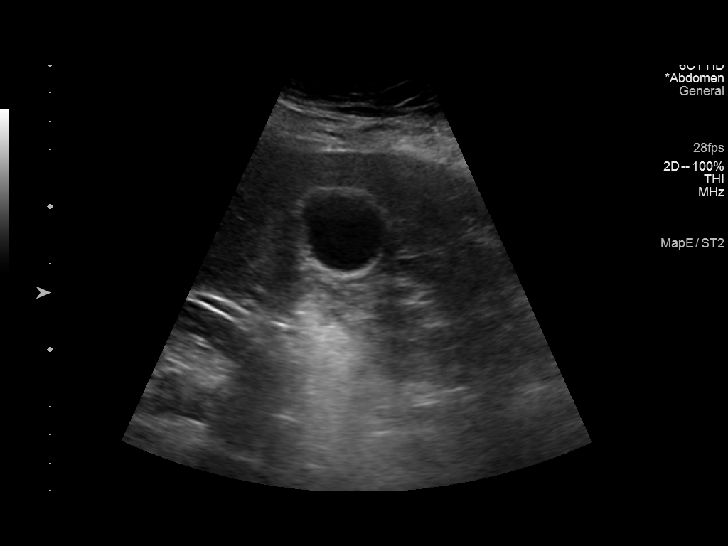
[im 19/49]
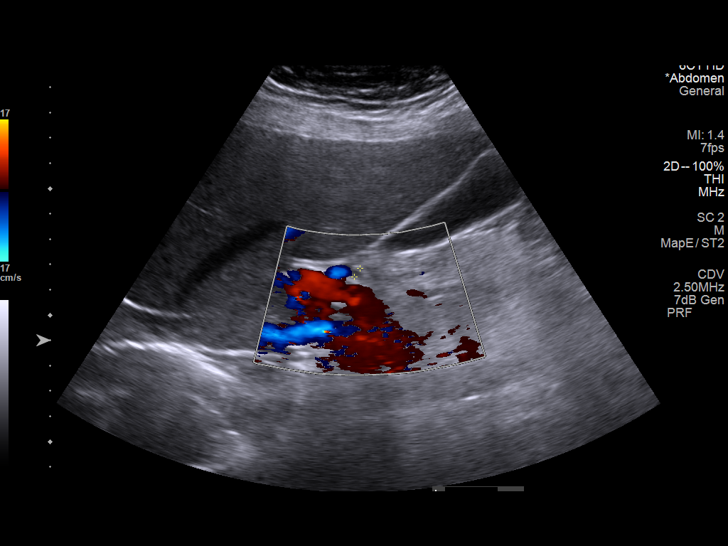
[im 23/49]
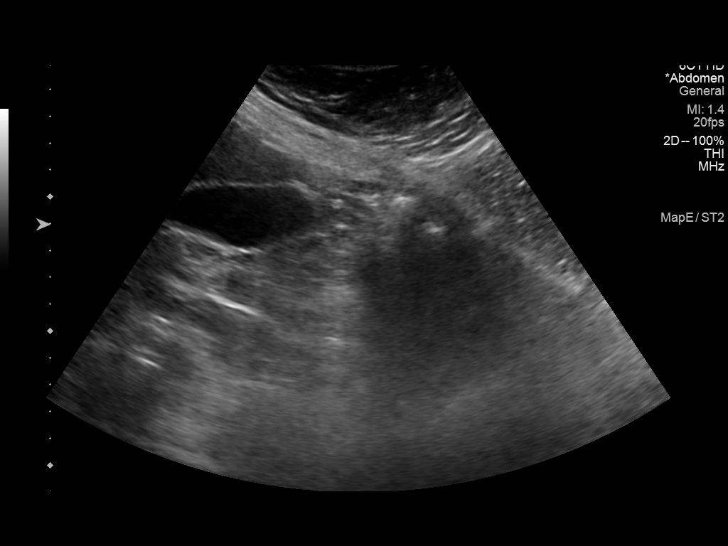
[im 27/49]
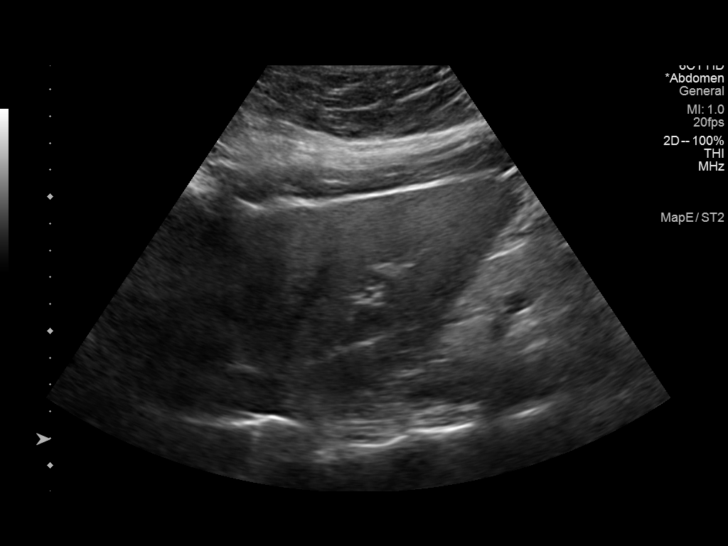
[im 31/49]
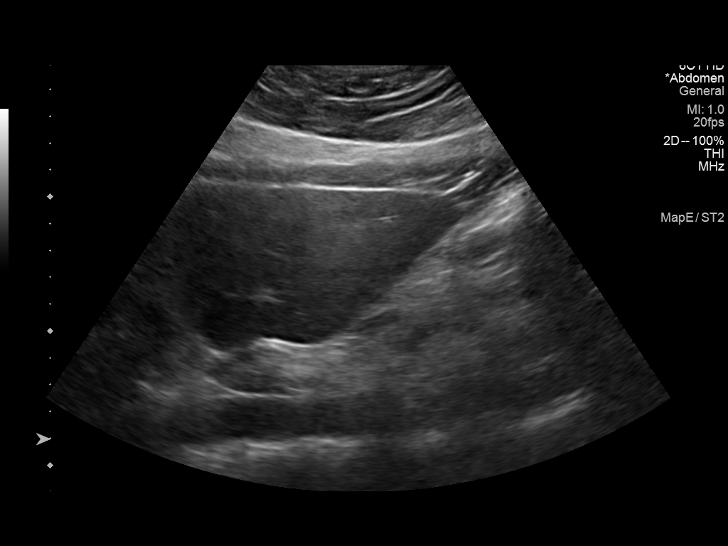
[im 33/49]
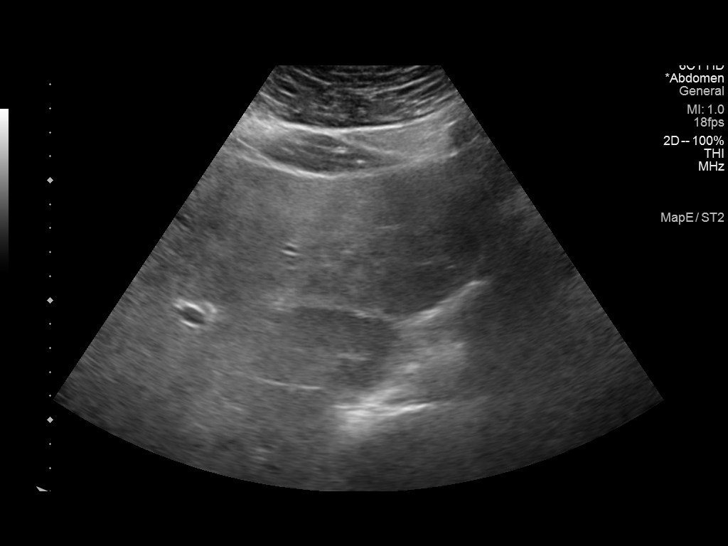
[im 37/49]
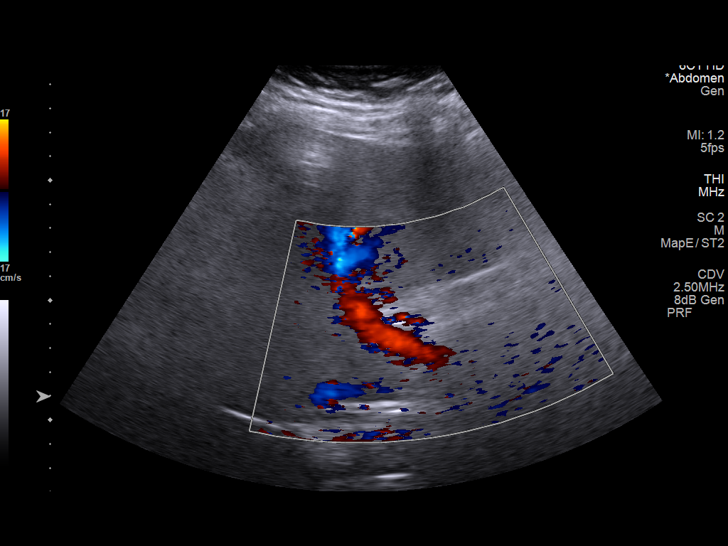
[im 41/49]
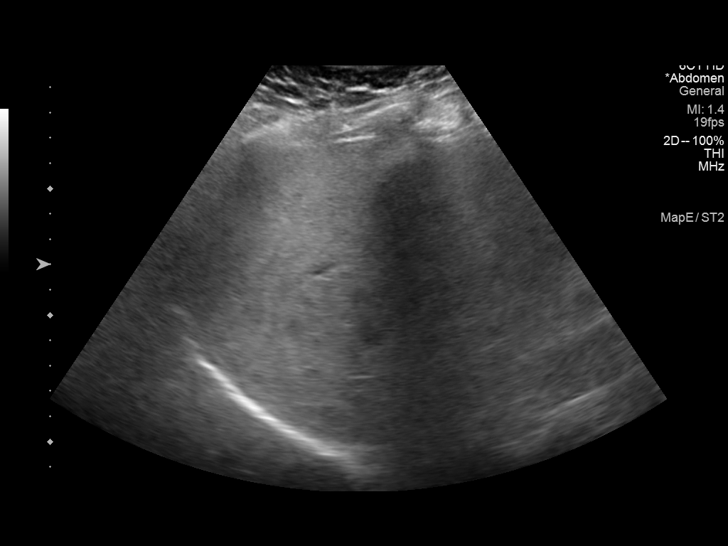
[im 45/49]
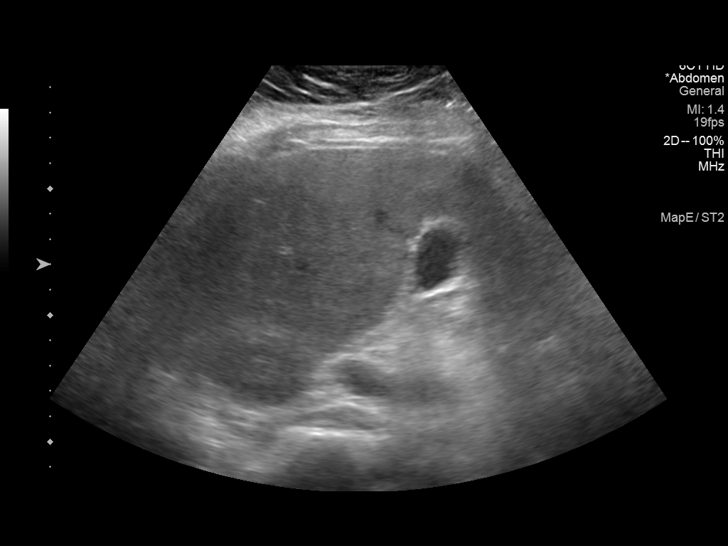
[im 49/49]
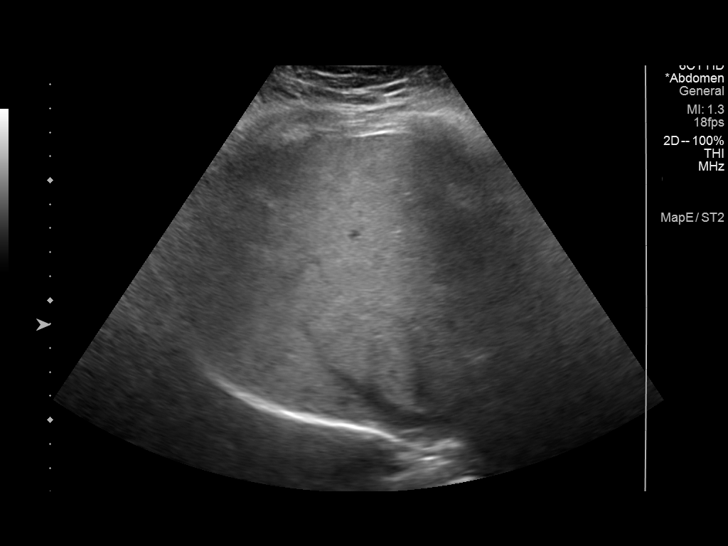

[14 of 25 positions shown; findings below may reference images not displayed]

FINDINGS: Gallbladder:

No gallstones or wall thickening visualized. No sonographic Murphy
sign noted by sonographer.

Common bile duct:

Diameter: 4 mm

Liver:

Diffusely increased hepatic parenchymal echogenicity. No focal liver
lesion identified. Portal vein is patent on color Doppler imaging
with normal direction of blood flow towards the liver.

Other: None.
IMPRESSION: The echogenicity of the liver is increased. This is a nonspecific
finding but is most commonly seen with fatty infiltration of the
liver. There are no obvious focal liver lesions.

## 2023-01-01 ENCOUNTER — Other Ambulatory Visit (HOSPITAL_COMMUNITY): Payer: Self-pay | Admitting: Medical

## 2023-01-01 ENCOUNTER — Ambulatory Visit (HOSPITAL_COMMUNITY)
Admission: RE | Admit: 2023-01-01 | Discharge: 2023-01-01 | Disposition: A | Discharge status: Home / Self Care | Payer: Self-pay | Source: Ambulatory Visit | Attending: Cardiology | Admitting: Cardiology

## 2023-01-01 DIAGNOSIS — M79604 Pain in right leg: Secondary | ICD-10-CM | POA: Insufficient documentation
